# Patient Record
Sex: Male | Born: 1955 | Race: Black or African American | Hispanic: No | Marital: Single
Health system: Southern US, Community
[De-identification: ages and names within clinical notes are randomized; demographics above are authoritative.]

---

## 2005-05-19 ENCOUNTER — Emergency Department: Payer: Self-pay | Admitting: Emergency Medicine

## 2007-07-17 ENCOUNTER — Inpatient Hospital Stay: Payer: Self-pay | Admitting: Internal Medicine

## 2007-07-17 ENCOUNTER — Other Ambulatory Visit: Payer: Self-pay

## 2008-01-11 ENCOUNTER — Ambulatory Visit: Payer: Self-pay | Admitting: Nurse Practitioner

## 2009-05-21 ENCOUNTER — Inpatient Hospital Stay: Payer: Self-pay | Admitting: Internal Medicine

## 2009-11-18 ENCOUNTER — Emergency Department: Payer: Self-pay | Admitting: Emergency Medicine

## 2010-09-12 ENCOUNTER — Emergency Department: Payer: Self-pay | Admitting: Emergency Medicine

## 2011-07-10 ENCOUNTER — Inpatient Hospital Stay: Payer: Self-pay | Admitting: Internal Medicine

## 2011-07-10 LAB — COMPREHENSIVE METABOLIC PANEL
Albumin: 3.2 g/dL — ABNORMAL LOW (ref 3.4–5.0)
BUN: 33 mg/dL — ABNORMAL HIGH (ref 7–18)
Bilirubin,Total: 0.9 mg/dL (ref 0.2–1.0)
Calcium, Total: 8.7 mg/dL (ref 8.5–10.1)
Creatinine: 1.9 mg/dL — ABNORMAL HIGH (ref 0.60–1.30)
Glucose: 130 mg/dL — ABNORMAL HIGH (ref 65–99)
Osmolality: 279 (ref 275–301)
SGOT(AST): 547 U/L — ABNORMAL HIGH (ref 15–37)
SGPT (ALT): 78 U/L
Total Protein: 8.3 g/dL — ABNORMAL HIGH (ref 6.4–8.2)

## 2011-07-10 LAB — LIPASE, BLOOD: Lipase: 1305 U/L — ABNORMAL HIGH (ref 73–393)

## 2011-07-10 LAB — CBC
HGB: 12.1 g/dL — ABNORMAL LOW (ref 13.0–18.0)
MCV: 90 fL (ref 80–100)
Platelet: 355 10*3/uL (ref 150–440)
RDW: 14 % (ref 11.5–14.5)
WBC: 9.4 10*3/uL (ref 3.8–10.6)

## 2011-07-10 LAB — URINALYSIS, COMPLETE
Bacteria: NONE SEEN
Bilirubin,UR: NEGATIVE
Glucose,UR: NEGATIVE mg/dL (ref 0–75)
Ketone: NEGATIVE
Ph: 5 (ref 4.5–8.0)
RBC,UR: <1 /HPF (ref 0–5)
Specific Gravity: 1.024 (ref 1.003–1.030)
Squamous Epithelial: <1
WBC UR: 1 /HPF (ref 0–5)

## 2011-07-10 LAB — TROPONIN I: Troponin-I: 0.08 ng/mL — ABNORMAL HIGH

## 2011-07-11 LAB — TROPONIN I
Troponin-I: 0.06 ng/mL — ABNORMAL HIGH
Troponin-I: 0.04 ng/mL

## 2011-07-11 LAB — DRUG SCREEN, URINE
Barbiturates, Ur Screen: NEGATIVE (ref ?–200)
Benzodiazepine, Ur Scrn: NEGATIVE (ref ?–200)
Cannabinoid 50 Ng, Ur ~~LOC~~: NEGATIVE (ref ?–50)
Cocaine Metabolite,Ur ~~LOC~~: POSITIVE (ref ?–300)
MDMA (Ecstasy)Ur Screen: NEGATIVE (ref ?–500)
Opiate, Ur Screen: NEGATIVE (ref ?–300)
Phencyclidine (PCP) Ur S: NEGATIVE (ref ?–25)

## 2011-07-11 LAB — COMPREHENSIVE METABOLIC PANEL
Albumin: 2.6 g/dL — ABNORMAL LOW (ref 3.4–5.0)
Alkaline Phosphatase: 97 U/L (ref 50–136)
Anion Gap: 11 (ref 7–16)
Bilirubin,Total: 1.1 mg/dL — ABNORMAL HIGH (ref 0.2–1.0)
Calcium, Total: 8.2 mg/dL — ABNORMAL LOW (ref 8.5–10.1)
Co2: 26 mmol/L (ref 21–32)
Creatinine: 1.52 mg/dL — ABNORMAL HIGH (ref 0.60–1.30)
EGFR (Non-African Amer.): 51 — ABNORMAL LOW
Osmolality: 279 (ref 275–301)
Potassium: 3.9 mmol/L (ref 3.5–5.1)
Sodium: 137 mmol/L (ref 136–145)

## 2011-07-11 LAB — AMYLASE: Amylase: 73 U/L (ref 25–115)

## 2011-07-11 LAB — CK TOTAL AND CKMB (NOT AT ARMC): CK-MB: 1.3 ng/mL (ref 0.5–3.6)

## 2011-07-12 LAB — CBC WITH DIFFERENTIAL/PLATELET
Basophil #: 0 10*3/uL (ref 0.0–0.1)
Basophil %: 0.1 %
Eosinophil #: 0.1 10*3/uL (ref 0.0–0.7)
HGB: 11.2 g/dL — ABNORMAL LOW (ref 13.0–18.0)
Lymphocyte %: 13 %
MCH: 30.3 pg (ref 26.0–34.0)
MCHC: 33.3 g/dL (ref 32.0–36.0)
Monocyte #: 1.6 10*3/uL — ABNORMAL HIGH (ref 0.0–0.7)
Monocyte %: 14.7 %
Neutrophil %: 71.5 %
Platelet: 269 10*3/uL (ref 150–440)
RBC: 3.69 10*6/uL — ABNORMAL LOW (ref 4.40–5.90)
RDW: 14.3 % (ref 11.5–14.5)
WBC: 10.8 10*3/uL — ABNORMAL HIGH (ref 3.8–10.6)

## 2011-07-12 LAB — COMPREHENSIVE METABOLIC PANEL
Albumin: 2.4 g/dL — ABNORMAL LOW (ref 3.4–5.0)
Alkaline Phosphatase: 99 U/L (ref 50–136)
Anion Gap: 5 — ABNORMAL LOW (ref 7–16)
BUN: 25 mg/dL — ABNORMAL HIGH (ref 7–18)
Calcium, Total: 8.3 mg/dL — ABNORMAL LOW (ref 8.5–10.1)
Chloride: 100 mmol/L (ref 98–107)
EGFR (African American): 49 — ABNORMAL LOW
Osmolality: 269 (ref 275–301)
Potassium: 4 mmol/L (ref 3.5–5.1)
SGOT(AST): 320 U/L — ABNORMAL HIGH (ref 15–37)
Sodium: 132 mmol/L — ABNORMAL LOW (ref 136–145)
Total Protein: 7.2 g/dL (ref 6.4–8.2)

## 2011-07-13 LAB — CBC WITH DIFFERENTIAL/PLATELET
Basophil #: 0 10*3/uL (ref 0.0–0.1)
Eosinophil #: 0 10*3/uL (ref 0.0–0.7)
Eosinophil %: 0.2 %
HGB: 10.8 g/dL — ABNORMAL LOW (ref 13.0–18.0)
Lymphocyte %: 10.4 %
MCHC: 33 g/dL (ref 32.0–36.0)
Monocyte #: 1.5 10*3/uL — ABNORMAL HIGH (ref 0.0–0.7)
Monocyte %: 12.4 %
Neutrophil #: 9.4 10*3/uL — ABNORMAL HIGH (ref 1.4–6.5)
Neutrophil %: 77 %
Platelet: 270 10*3/uL (ref 150–440)
RBC: 3.57 10*6/uL — ABNORMAL LOW (ref 4.40–5.90)
WBC: 12.2 10*3/uL — ABNORMAL HIGH (ref 3.8–10.6)

## 2011-07-13 LAB — HEPATIC FUNCTION PANEL A (ARMC)
Alkaline Phosphatase: 84 U/L (ref 50–136)
Bilirubin, Direct: 0.7 mg/dL — ABNORMAL HIGH (ref 0.00–0.20)
SGOT(AST): 259 U/L — ABNORMAL HIGH (ref 15–37)
SGPT (ALT): 41 U/L

## 2011-07-13 LAB — PROTIME-INR: INR: 1.2

## 2011-07-13 LAB — BASIC METABOLIC PANEL
BUN: 26 mg/dL — ABNORMAL HIGH (ref 7–18)
Calcium, Total: 8.8 mg/dL (ref 8.5–10.1)
Chloride: 100 mmol/L (ref 98–107)
Co2: 25 mmol/L (ref 21–32)
Creatinine: 1.78 mg/dL — ABNORMAL HIGH (ref 0.60–1.30)
Osmolality: 270 (ref 275–301)
Potassium: 4.1 mmol/L (ref 3.5–5.1)

## 2011-07-14 LAB — BASIC METABOLIC PANEL
Calcium, Total: 8.6 mg/dL (ref 8.5–10.1)
Co2: 24 mmol/L (ref 21–32)
EGFR (African American): >60
EGFR (Non-African Amer.): 55 — ABNORMAL LOW
Glucose: 124 mg/dL — ABNORMAL HIGH (ref 65–99)
Osmolality: 277 (ref 275–301)
Potassium: 4.1 mmol/L (ref 3.5–5.1)

## 2011-07-14 LAB — CBC WITH DIFFERENTIAL/PLATELET
Basophil #: 0 10*3/uL (ref 0.0–0.1)
Eosinophil %: 0.4 %
HCT: 34.6 % — ABNORMAL LOW (ref 40.0–52.0)
Lymphocyte #: 1.1 10*3/uL (ref 1.0–3.6)
MCHC: 32.3 g/dL (ref 32.0–36.0)
MCV: 92 fL (ref 80–100)
Monocyte %: 14.1 %
Neutrophil #: 7.1 10*3/uL — ABNORMAL HIGH (ref 1.4–6.5)
RBC: 3.75 10*6/uL — ABNORMAL LOW (ref 4.40–5.90)
RDW: 13.8 % (ref 11.5–14.5)
WBC: 9.7 10*3/uL (ref 3.8–10.6)

## 2011-07-14 LAB — AFP TUMOR MARKER: AFP-Tumor Marker: 485.4 ng/mL — ABNORMAL HIGH (ref 0.0–8.3)

## 2011-07-14 LAB — HEPATIC FUNCTION PANEL A (ARMC)
Albumin: 2.3 g/dL — ABNORMAL LOW (ref 3.4–5.0)
Alkaline Phosphatase: 79 U/L (ref 50–136)
Bilirubin,Total: 1.1 mg/dL — ABNORMAL HIGH (ref 0.2–1.0)
SGOT(AST): 111 U/L — ABNORMAL HIGH (ref 15–37)
SGPT (ALT): 36 U/L
Total Protein: 7.8 g/dL (ref 6.4–8.2)

## 2011-07-15 ENCOUNTER — Ambulatory Visit: Payer: Self-pay | Admitting: Oncology

## 2011-07-15 LAB — PROTIME-INR: INR: 1.1

## 2011-07-15 LAB — COMPREHENSIVE METABOLIC PANEL
Anion Gap: 10 (ref 7–16)
Bilirubin,Total: 0.8 mg/dL (ref 0.2–1.0)
Calcium, Total: 8.1 mg/dL — ABNORMAL LOW (ref 8.5–10.1)
Chloride: 101 mmol/L (ref 98–107)
Co2: 22 mmol/L (ref 21–32)
Creatinine: 1.16 mg/dL (ref 0.60–1.30)
EGFR (African American): >60
EGFR (Non-African Amer.): >60
Osmolality: 270 (ref 275–301)
SGPT (ALT): 27 U/L
Total Protein: 6.6 g/dL (ref 6.4–8.2)

## 2011-07-22 ENCOUNTER — Ambulatory Visit: Payer: Self-pay | Admitting: Oncology

## 2011-07-23 ENCOUNTER — Ambulatory Visit: Payer: Self-pay | Admitting: Oncology

## 2011-07-29 LAB — COMPREHENSIVE METABOLIC PANEL
Alkaline Phosphatase: 284 U/L — ABNORMAL HIGH (ref 50–136)
Bilirubin,Total: 0.6 mg/dL (ref 0.2–1.0)
Calcium, Total: 9.1 mg/dL (ref 8.5–10.1)
Chloride: 98 mmol/L (ref 98–107)
Co2: 30 mmol/L (ref 21–32)
EGFR (African American): 58 — ABNORMAL LOW
EGFR (Non-African Amer.): 48 — ABNORMAL LOW
Glucose: 126 mg/dL — ABNORMAL HIGH (ref 65–99)
SGOT(AST): 94 U/L — ABNORMAL HIGH (ref 15–37)
SGPT (ALT): 65 U/L

## 2011-07-29 LAB — CBC CANCER CENTER
Basophil #: 0 x10 3/mm (ref 0.0–0.1)
Eosinophil %: 5 %
HCT: 36.1 % — ABNORMAL LOW (ref 40.0–52.0)
HGB: 11.9 g/dL — ABNORMAL LOW (ref 13.0–18.0)
Lymphocyte #: 1.5 x10 3/mm (ref 1.0–3.6)
MCHC: 33.1 g/dL (ref 32.0–36.0)
MCV: 90 fL (ref 80–100)
Monocyte #: 0.8 x10 3/mm — ABNORMAL HIGH (ref 0.0–0.7)
Monocyte %: 16.6 %
Neutrophil %: 45.1 %
Platelet: 392 x10 3/mm (ref 150–440)
RDW: 14.9 % — ABNORMAL HIGH (ref 11.5–14.5)
WBC: 4.6 x10 3/mm (ref 3.8–10.6)

## 2011-07-29 LAB — PROTIME-INR
INR: 1
Prothrombin Time: 13.1 secs (ref 11.5–14.7)

## 2011-07-30 ENCOUNTER — Ambulatory Visit: Payer: Self-pay | Admitting: Oncology

## 2011-08-19 LAB — COMPREHENSIVE METABOLIC PANEL
Alkaline Phosphatase: 166 U/L — ABNORMAL HIGH (ref 50–136)
Anion Gap: 6 — ABNORMAL LOW (ref 7–16)
BUN: 19 mg/dL — ABNORMAL HIGH (ref 7–18)
Calcium, Total: 8.3 mg/dL — ABNORMAL LOW (ref 8.5–10.1)
Co2: 30 mmol/L (ref 21–32)
EGFR (Non-African Amer.): 53 — ABNORMAL LOW
Osmolality: 271 (ref 275–301)
SGOT(AST): 126 U/L — ABNORMAL HIGH (ref 15–37)
Sodium: 135 mmol/L — ABNORMAL LOW (ref 136–145)
Total Protein: 8.3 g/dL — ABNORMAL HIGH (ref 6.4–8.2)

## 2011-08-19 LAB — CBC CANCER CENTER
Basophil #: 0.1 x10 3/mm (ref 0.0–0.1)
Eosinophil #: 0.1 x10 3/mm (ref 0.0–0.7)
Eosinophil %: 1.9 %
MCH: 29.5 pg (ref 26.0–34.0)
Monocyte #: 1 x10 3/mm — ABNORMAL HIGH (ref 0.0–0.7)
Monocyte %: 13.3 %
Neutrophil %: 64.4 %
Platelet: 461 x10 3/mm — ABNORMAL HIGH (ref 150–440)
RBC: 3.64 10*6/uL — ABNORMAL LOW (ref 4.40–5.90)
RDW: 15.1 % — ABNORMAL HIGH (ref 11.5–14.5)
WBC: 7.8 x10 3/mm (ref 3.8–10.6)

## 2011-08-25 ENCOUNTER — Ambulatory Visit: Payer: Self-pay | Admitting: Gastroenterology

## 2011-08-26 ENCOUNTER — Emergency Department: Payer: Self-pay | Admitting: Emergency Medicine

## 2011-08-26 LAB — COMPREHENSIVE METABOLIC PANEL
Albumin: 2.8 g/dL — ABNORMAL LOW (ref 3.4–5.0)
Alkaline Phosphatase: 196 U/L — ABNORMAL HIGH (ref 50–136)
Anion Gap: 8 (ref 7–16)
BUN: 14 mg/dL (ref 7–18)
Bilirubin,Total: 0.8 mg/dL (ref 0.2–1.0)
Chloride: 101 mmol/L (ref 98–107)
Co2: 27 mmol/L (ref 21–32)
Creatinine: 1.01 mg/dL (ref 0.60–1.30)
Glucose: 161 mg/dL — ABNORMAL HIGH (ref 65–99)
Osmolality: 276 (ref 275–301)
SGOT(AST): 130 U/L — ABNORMAL HIGH (ref 15–37)
SGPT (ALT): 37 U/L
Sodium: 136 mmol/L (ref 136–145)

## 2011-08-26 LAB — APTT: Activated PTT: 31.1 secs (ref 23.6–35.9)

## 2011-08-26 LAB — CK TOTAL AND CKMB (NOT AT ARMC): CK, Total: 100 U/L (ref 35–232)

## 2011-08-26 LAB — DIFFERENTIAL
Eosinophil %: 1.6 %
Lymphocyte %: 23.4 %
Monocyte #: 0.5 10*3/uL (ref 0.0–0.7)
Monocyte %: 8.7 %
Neutrophil %: 65.9 %

## 2011-08-26 LAB — SEDIMENTATION RATE: Erythrocyte Sed Rate: 66 mm/hr — ABNORMAL HIGH (ref 0–20)

## 2011-08-26 LAB — PROTIME-INR
INR: 1
Prothrombin Time: 13.9 secs (ref 11.5–14.7)

## 2011-08-26 LAB — CBC
HCT: 37.1 % — ABNORMAL LOW (ref 40.0–52.0)
HGB: 12.2 g/dL — ABNORMAL LOW (ref 13.0–18.0)
MCH: 29.4 pg (ref 26.0–34.0)
MCV: 89 fL (ref 80–100)
RBC: 4.16 10*6/uL — ABNORMAL LOW (ref 4.40–5.90)
RDW: 14.8 % — ABNORMAL HIGH (ref 11.5–14.5)
WBC: 5.8 10*3/uL (ref 3.8–10.6)

## 2011-08-30 ENCOUNTER — Ambulatory Visit: Payer: Self-pay | Admitting: Oncology

## 2011-09-09 LAB — CBC CANCER CENTER
Basophil #: 0 x10 3/mm (ref 0.0–0.1)
Basophil %: 0.7 %
HCT: 30.3 % — ABNORMAL LOW (ref 40.0–52.0)
HGB: 10.2 g/dL — ABNORMAL LOW (ref 13.0–18.0)
Lymphocyte %: 27.7 %
MCHC: 33.5 g/dL (ref 32.0–36.0)
MCV: 88 fL (ref 80–100)
Monocyte #: 1 x10 3/mm (ref 0.2–1.0)
RBC: 3.46 10*6/uL — ABNORMAL LOW (ref 4.40–5.90)
WBC: 5.5 x10 3/mm (ref 3.8–10.6)

## 2011-09-09 LAB — COMPREHENSIVE METABOLIC PANEL
Albumin: 2.8 g/dL — ABNORMAL LOW (ref 3.4–5.0)
Anion Gap: 9 (ref 7–16)
BUN: 26 mg/dL — ABNORMAL HIGH (ref 7–18)
Bilirubin,Total: 0.5 mg/dL (ref 0.2–1.0)
Calcium, Total: 8.9 mg/dL (ref 8.5–10.1)
Osmolality: 273 (ref 275–301)
SGPT (ALT): 30 U/L
Total Protein: 9 g/dL — ABNORMAL HIGH (ref 6.4–8.2)

## 2011-09-29 ENCOUNTER — Ambulatory Visit: Payer: Self-pay | Admitting: Oncology

## 2011-10-06 ENCOUNTER — Emergency Department: Payer: Self-pay | Admitting: Emergency Medicine

## 2011-10-06 LAB — CBC
HCT: 28.7 % — ABNORMAL LOW (ref 40.0–52.0)
HGB: 9.5 g/dL — ABNORMAL LOW (ref 13.0–18.0)
MCH: 28.8 pg (ref 26.0–34.0)
MCHC: 33.1 g/dL (ref 32.0–36.0)
Platelet: 304 10*3/uL (ref 150–440)
RBC: 3.29 10*6/uL — ABNORMAL LOW (ref 4.40–5.90)
WBC: 8.1 10*3/uL (ref 3.8–10.6)

## 2011-10-06 LAB — COMPREHENSIVE METABOLIC PANEL
Alkaline Phosphatase: 139 U/L — ABNORMAL HIGH (ref 50–136)
Anion Gap: 8 (ref 7–16)
Calcium, Total: 8.3 mg/dL — ABNORMAL LOW (ref 8.5–10.1)
Chloride: 100 mmol/L (ref 98–107)
Co2: 25 mmol/L (ref 21–32)
Creatinine: 0.98 mg/dL (ref 0.60–1.30)
Osmolality: 269 (ref 275–301)
SGOT(AST): 71 U/L — ABNORMAL HIGH (ref 15–37)
SGPT (ALT): 17 U/L
Sodium: 133 mmol/L — ABNORMAL LOW (ref 136–145)
Total Protein: 8.3 g/dL — ABNORMAL HIGH (ref 6.4–8.2)

## 2011-10-06 LAB — URINALYSIS, COMPLETE
Bacteria: NONE SEEN
Bilirubin,UR: NEGATIVE
Blood: NEGATIVE
Glucose,UR: NEGATIVE mg/dL (ref 0–75)
Ketone: NEGATIVE
Nitrite: NEGATIVE
Protein: 30
RBC,UR: 1 /HPF (ref 0–5)
Squamous Epithelial: <1

## 2011-10-06 LAB — TROPONIN I: Troponin-I: <0.02 ng/mL

## 2011-10-06 LAB — PROTIME-INR: INR: 1.1

## 2011-10-06 LAB — LIPASE, BLOOD: Lipase: 110 U/L (ref 73–393)

## 2011-10-06 LAB — APTT: Activated PTT: 33.7 secs (ref 23.6–35.9)

## 2011-10-07 LAB — CBC CANCER CENTER
Basophil %: 0.6 %
Eosinophil %: 1.3 %
HGB: 9.7 g/dL — ABNORMAL LOW (ref 13.0–18.0)
MCV: 88 fL (ref 80–100)
Neutrophil %: 67.6 %
Platelet: 320 x10 3/mm (ref 150–440)
RBC: 3.42 10*6/uL — ABNORMAL LOW (ref 4.40–5.90)
RDW: 15.9 % — ABNORMAL HIGH (ref 11.5–14.5)
WBC: 6.7 x10 3/mm (ref 3.8–10.6)

## 2011-10-07 LAB — COMPREHENSIVE METABOLIC PANEL
Bilirubin,Total: 0.7 mg/dL (ref 0.2–1.0)
Calcium, Total: 8.8 mg/dL (ref 8.5–10.1)
Co2: 26 mmol/L (ref 21–32)
Creatinine: 0.96 mg/dL (ref 0.60–1.30)
EGFR (Non-African Amer.): >60
Glucose: 157 mg/dL — ABNORMAL HIGH (ref 65–99)
Osmolality: 270 (ref 275–301)
SGOT(AST): 66 U/L — ABNORMAL HIGH (ref 15–37)
Sodium: 133 mmol/L — ABNORMAL LOW (ref 136–145)
Total Protein: 9.4 g/dL — ABNORMAL HIGH (ref 6.4–8.2)

## 2011-10-14 LAB — CBC CANCER CENTER
Basophil #: 0 x10 3/mm (ref 0.0–0.1)
Basophil %: 0.5 %
Eosinophil #: 0 x10 3/mm (ref 0.0–0.7)
HCT: 30.7 % — ABNORMAL LOW (ref 40.0–52.0)
Lymphocyte %: 14.1 %
MCH: 28.4 pg (ref 26.0–34.0)
MCV: 87 fL (ref 80–100)
Monocyte #: 1 x10 3/mm (ref 0.2–1.0)
Monocyte %: 11.8 %
Neutrophil #: 6.5 x10 3/mm (ref 1.4–6.5)
Neutrophil %: 73.3 %
Platelet: 430 x10 3/mm (ref 150–440)
RBC: 3.54 10*6/uL — ABNORMAL LOW (ref 4.40–5.90)
RDW: 16.5 % — ABNORMAL HIGH (ref 11.5–14.5)
WBC: 8.9 x10 3/mm (ref 3.8–10.6)

## 2011-10-14 LAB — COMPREHENSIVE METABOLIC PANEL
Albumin: 2.4 g/dL — ABNORMAL LOW (ref 3.4–5.0)
BUN: 16 mg/dL (ref 7–18)
Calcium, Total: 8.5 mg/dL (ref 8.5–10.1)
Chloride: 95 mmol/L — ABNORMAL LOW (ref 98–107)
Co2: 26 mmol/L (ref 21–32)
EGFR (African American): >60
EGFR (Non-African Amer.): >60
Glucose: 161 mg/dL — ABNORMAL HIGH (ref 65–99)
Osmolality: 265 (ref 275–301)
Potassium: 3.8 mmol/L (ref 3.5–5.1)
SGOT(AST): 122 U/L — ABNORMAL HIGH (ref 15–37)
SGPT (ALT): 22 U/L
Sodium: 130 mmol/L — ABNORMAL LOW (ref 136–145)
Total Protein: 8.8 g/dL — ABNORMAL HIGH (ref 6.4–8.2)

## 2011-10-21 LAB — COMPREHENSIVE METABOLIC PANEL
Albumin: 2.5 g/dL — ABNORMAL LOW (ref 3.4–5.0)
Calcium, Total: 8.6 mg/dL (ref 8.5–10.1)
Chloride: 98 mmol/L (ref 98–107)
Co2: 25 mmol/L (ref 21–32)
EGFR (African American): >60
Glucose: 116 mg/dL — ABNORMAL HIGH (ref 65–99)
Osmolality: 269 (ref 275–301)
SGOT(AST): 131 U/L — ABNORMAL HIGH (ref 15–37)
SGPT (ALT): 34 U/L
Sodium: 134 mmol/L — ABNORMAL LOW (ref 136–145)

## 2011-10-21 LAB — CBC CANCER CENTER
Basophil #: 0.1 x10 3/mm (ref 0.0–0.1)
Basophil %: 0.9 %
Eosinophil #: 0.1 x10 3/mm (ref 0.0–0.7)
Eosinophil %: 1.8 %
HCT: 32.7 % — ABNORMAL LOW (ref 40.0–52.0)
MCH: 28.1 pg (ref 26.0–34.0)
MCV: 87 fL (ref 80–100)
Monocyte #: 0.8 x10 3/mm (ref 0.2–1.0)
Neutrophil #: 4.2 x10 3/mm (ref 1.4–6.5)
Neutrophil %: 62.9 %
Platelet: 453 x10 3/mm — ABNORMAL HIGH (ref 150–440)
RBC: 3.75 10*6/uL — ABNORMAL LOW (ref 4.40–5.90)
WBC: 6.7 x10 3/mm (ref 3.8–10.6)

## 2011-10-30 ENCOUNTER — Ambulatory Visit: Payer: Self-pay | Admitting: Oncology

## 2011-11-04 LAB — CBC CANCER CENTER
Basophil #: 0.1 x10 3/mm (ref 0.0–0.1)
Basophil %: 1 %
Eosinophil %: 3.9 %
HCT: 35 % — ABNORMAL LOW (ref 40.0–52.0)
HGB: 11.4 g/dL — ABNORMAL LOW (ref 13.0–18.0)
Lymphocyte #: 1.4 x10 3/mm (ref 1.0–3.6)
Lymphocyte %: 28.1 %
MCH: 28.2 pg (ref 26.0–34.0)
MCV: 87 fL (ref 80–100)
Platelet: 307 x10 3/mm (ref 150–440)

## 2011-11-04 LAB — COMPREHENSIVE METABOLIC PANEL
Albumin: 2.9 g/dL — ABNORMAL LOW (ref 3.4–5.0)
Alkaline Phosphatase: 287 U/L — ABNORMAL HIGH (ref 50–136)
BUN: 14 mg/dL (ref 7–18)
Chloride: 97 mmol/L — ABNORMAL LOW (ref 98–107)
Co2: 26 mmol/L (ref 21–32)
EGFR (African American): >60
EGFR (Non-African Amer.): >60
Osmolality: 271 (ref 275–301)
SGPT (ALT): 42 U/L
Sodium: 134 mmol/L — ABNORMAL LOW (ref 136–145)
Total Protein: 10.4 g/dL — ABNORMAL HIGH (ref 6.4–8.2)

## 2011-11-10 ENCOUNTER — Emergency Department: Payer: Self-pay | Admitting: Emergency Medicine

## 2011-11-10 LAB — COMPREHENSIVE METABOLIC PANEL
Alkaline Phosphatase: 243 U/L — ABNORMAL HIGH (ref 50–136)
Bilirubin,Total: 1.4 mg/dL — ABNORMAL HIGH (ref 0.2–1.0)
Chloride: 101 mmol/L (ref 98–107)
Co2: 25 mmol/L (ref 21–32)
Creatinine: 0.93 mg/dL (ref 0.60–1.30)
EGFR (African American): >60
EGFR (Non-African Amer.): >60
Glucose: 133 mg/dL — ABNORMAL HIGH (ref 65–99)
Osmolality: 275 (ref 275–301)
Total Protein: 10.1 g/dL — ABNORMAL HIGH (ref 6.4–8.2)

## 2011-11-10 LAB — CBC CANCER CENTER
Basophil #: 0 x10 3/mm (ref 0.0–0.1)
Basophil %: 0.3 %
Eosinophil #: 0 x10 3/mm (ref 0.0–0.7)
Eosinophil %: 0.6 %
Lymphocyte #: 0.8 x10 3/mm — ABNORMAL LOW (ref 1.0–3.6)
Lymphocyte %: 10.7 %
MCH: 28.4 pg (ref 26.0–34.0)
MCHC: 32.4 g/dL (ref 32.0–36.0)
MCV: 88 fL (ref 80–100)
Monocyte #: 0.4 x10 3/mm (ref 0.2–1.0)
Monocyte %: 4.8 %
Neutrophil %: 83.6 %
RDW: 18.9 % — ABNORMAL HIGH (ref 11.5–14.5)
WBC: 7.8 x10 3/mm (ref 3.8–10.6)

## 2011-11-29 ENCOUNTER — Ambulatory Visit: Payer: Self-pay | Admitting: Oncology

## 2011-12-01 LAB — CBC CANCER CENTER
Basophil #: 0.1 x10 3/mm (ref 0.0–0.1)
Basophil %: 1.2 %
Eosinophil #: 0.2 x10 3/mm (ref 0.0–0.7)
HGB: 10 g/dL — ABNORMAL LOW (ref 13.0–18.0)
Lymphocyte #: 1.3 x10 3/mm (ref 1.0–3.6)
MCH: 28.9 pg (ref 26.0–34.0)
MCHC: 32.2 g/dL (ref 32.0–36.0)
MCV: 90 fL (ref 80–100)
Monocyte #: 0.9 x10 3/mm (ref 0.2–1.0)
Neutrophil %: 64.3 %
Platelet: 353 x10 3/mm (ref 150–440)

## 2011-12-01 LAB — HEPATIC FUNCTION PANEL A (ARMC)
Albumin: 2.4 g/dL — ABNORMAL LOW (ref 3.4–5.0)
Bilirubin, Direct: 0.4 mg/dL — ABNORMAL HIGH (ref 0.00–0.20)
Bilirubin,Total: 0.8 mg/dL (ref 0.2–1.0)
SGOT(AST): 105 U/L — ABNORMAL HIGH (ref 15–37)
SGPT (ALT): 35 U/L
Total Protein: 8.2 g/dL (ref 6.4–8.2)

## 2011-12-10 ENCOUNTER — Emergency Department: Payer: Self-pay | Admitting: Emergency Medicine

## 2011-12-10 LAB — CBC WITH DIFFERENTIAL/PLATELET
Basophil #: 0 10*3/uL (ref 0.0–0.1)
Eosinophil %: 1 %
Lymphocyte #: 0.6 10*3/uL — ABNORMAL LOW (ref 1.0–3.6)
Lymphocyte %: 10.3 %
MCH: 29.7 pg (ref 26.0–34.0)
MCV: 89 fL (ref 80–100)
Monocyte #: 0.7 x10 3/mm (ref 0.2–1.0)
Neutrophil %: 75.8 %
Platelet: 359 10*3/uL (ref 150–440)
RBC: 3.64 10*6/uL — ABNORMAL LOW (ref 4.40–5.90)
RDW: 18.1 % — ABNORMAL HIGH (ref 11.5–14.5)
WBC: 5.6 10*3/uL (ref 3.8–10.6)

## 2011-12-10 LAB — COMPREHENSIVE METABOLIC PANEL
Bilirubin,Total: 1.4 mg/dL — ABNORMAL HIGH (ref 0.2–1.0)
Calcium, Total: 8.7 mg/dL (ref 8.5–10.1)
Chloride: 99 mmol/L (ref 98–107)
Co2: 25 mmol/L (ref 21–32)
EGFR (African American): >60
EGFR (Non-African Amer.): >60
Osmolality: 272 (ref 275–301)
Potassium: 3.7 mmol/L (ref 3.5–5.1)
SGOT(AST): 180 U/L — ABNORMAL HIGH (ref 15–37)
Sodium: 135 mmol/L — ABNORMAL LOW (ref 136–145)
Total Protein: 8.9 g/dL — ABNORMAL HIGH (ref 6.4–8.2)

## 2011-12-10 LAB — LIPASE, BLOOD: Lipase: 109 U/L (ref 73–393)

## 2011-12-15 ENCOUNTER — Emergency Department: Payer: Self-pay | Admitting: Emergency Medicine

## 2011-12-15 LAB — CBC
HCT: 35 % — ABNORMAL LOW (ref 40.0–52.0)
HGB: 11.5 g/dL — ABNORMAL LOW (ref 13.0–18.0)
MCH: 29.2 pg (ref 26.0–34.0)
MCHC: 32.8 g/dL (ref 32.0–36.0)
MCV: 89 fL (ref 80–100)
Platelet: 389 10*3/uL (ref 150–440)
RDW: 18 % — ABNORMAL HIGH (ref 11.5–14.5)

## 2011-12-15 LAB — URINALYSIS, COMPLETE
Bilirubin,UR: NEGATIVE
Blood: NEGATIVE
Glucose,UR: NEGATIVE mg/dL (ref 0–75)
Protein: 100
RBC,UR: NONE SEEN /HPF (ref 0–5)
Specific Gravity: 1.02 (ref 1.003–1.030)
WBC UR: 3 /HPF (ref 0–5)

## 2011-12-15 LAB — COMPREHENSIVE METABOLIC PANEL
Albumin: 2.8 g/dL — ABNORMAL LOW (ref 3.4–5.0)
Alkaline Phosphatase: 536 U/L — ABNORMAL HIGH (ref 50–136)
Anion Gap: 8 (ref 7–16)
BUN: 13 mg/dL (ref 7–18)
Bilirubin,Total: 1.5 mg/dL — ABNORMAL HIGH (ref 0.2–1.0)
Calcium, Total: 8.9 mg/dL (ref 8.5–10.1)
Chloride: 101 mmol/L (ref 98–107)
Creatinine: 0.8 mg/dL (ref 0.60–1.30)
EGFR (African American): >60
Glucose: 111 mg/dL — ABNORMAL HIGH (ref 65–99)
Potassium: 4.4 mmol/L (ref 3.5–5.1)
SGOT(AST): 176 U/L — ABNORMAL HIGH (ref 15–37)
Total Protein: 9.5 g/dL — ABNORMAL HIGH (ref 6.4–8.2)

## 2011-12-15 LAB — PROTIME-INR: Prothrombin Time: 14.4 secs (ref 11.5–14.7)

## 2011-12-15 LAB — LIPASE, BLOOD: Lipase: 81 U/L (ref 73–393)

## 2011-12-17 ENCOUNTER — Emergency Department: Payer: Self-pay | Admitting: Emergency Medicine

## 2011-12-17 ENCOUNTER — Emergency Department: Payer: Self-pay | Admitting: Internal Medicine

## 2011-12-17 LAB — CBC
HCT: 31.7 % — ABNORMAL LOW (ref 40.0–52.0)
HGB: 10.5 g/dL — ABNORMAL LOW (ref 13.0–18.0)
MCH: 29.1 pg (ref 26.0–34.0)
MCHC: 33 g/dL (ref 32.0–36.0)
MCV: 88 fL (ref 80–100)
Platelet: 431 10*3/uL (ref 150–440)
RBC: 3.6 10*6/uL — ABNORMAL LOW (ref 4.40–5.90)
RDW: 18 % — ABNORMAL HIGH (ref 11.5–14.5)

## 2011-12-17 LAB — URINALYSIS, COMPLETE
Bacteria: NONE SEEN
Hyaline Cast: 6
Leukocyte Esterase: NEGATIVE
Nitrite: NEGATIVE
Protein: 30
RBC,UR: <1 /HPF (ref 0–5)
Specific Gravity: 1.016 (ref 1.003–1.030)
Squamous Epithelial: NONE SEEN
WBC UR: 1 /HPF (ref 0–5)

## 2011-12-17 LAB — COMPREHENSIVE METABOLIC PANEL
Albumin: 2.7 g/dL — ABNORMAL LOW (ref 3.4–5.0)
Anion Gap: 11 (ref 7–16)
BUN: 16 mg/dL (ref 7–18)
Bilirubin,Total: 1.3 mg/dL — ABNORMAL HIGH (ref 0.2–1.0)
Chloride: 103 mmol/L (ref 98–107)
Co2: 21 mmol/L (ref 21–32)
Creatinine: 1.03 mg/dL (ref 0.60–1.30)
EGFR (African American): >60
Osmolality: 272 (ref 275–301)
Potassium: 3.3 mmol/L — ABNORMAL LOW (ref 3.5–5.1)
Total Protein: 8.8 g/dL — ABNORMAL HIGH (ref 6.4–8.2)

## 2011-12-17 LAB — LIPASE, BLOOD: Lipase: 61 U/L — ABNORMAL LOW (ref 73–393)

## 2011-12-19 ENCOUNTER — Emergency Department: Payer: Self-pay | Admitting: *Deleted

## 2011-12-19 LAB — URINALYSIS, COMPLETE
Bacteria: NONE SEEN
Bilirubin,UR: NEGATIVE
Blood: NEGATIVE
Glucose,UR: NEGATIVE mg/dL (ref 0–75)
Hyaline Cast: 9
Leukocyte Esterase: NEGATIVE
Nitrite: NEGATIVE
Ph: 5 (ref 4.5–8.0)
RBC,UR: 9 /HPF (ref 0–5)
Specific Gravity: 1.024 (ref 1.003–1.030)
Squamous Epithelial: <1

## 2011-12-19 LAB — COMPREHENSIVE METABOLIC PANEL
Albumin: 2.7 g/dL — ABNORMAL LOW (ref 3.4–5.0)
Anion Gap: 10 (ref 7–16)
BUN: 11 mg/dL (ref 7–18)
Bilirubin,Total: 1.8 mg/dL — ABNORMAL HIGH (ref 0.2–1.0)
Chloride: 104 mmol/L (ref 98–107)
Creatinine: 0.86 mg/dL (ref 0.60–1.30)
EGFR (African American): >60
Glucose: 94 mg/dL (ref 65–99)
Osmolality: 273 (ref 275–301)
Potassium: 3.3 mmol/L — ABNORMAL LOW (ref 3.5–5.1)
SGOT(AST): 115 U/L — ABNORMAL HIGH (ref 15–37)
Sodium: 137 mmol/L (ref 136–145)
Total Protein: 8.7 g/dL — ABNORMAL HIGH (ref 6.4–8.2)

## 2011-12-19 LAB — CBC
MCH: 28.6 pg (ref 26.0–34.0)
MCHC: 31.9 g/dL — ABNORMAL LOW (ref 32.0–36.0)
Platelet: 361 10*3/uL (ref 150–440)
WBC: 7.2 10*3/uL (ref 3.8–10.6)

## 2011-12-19 LAB — PROTIME-INR: INR: 1.2

## 2011-12-23 ENCOUNTER — Ambulatory Visit: Payer: Self-pay | Admitting: Oncology

## 2011-12-23 LAB — PROTIME-INR
INR: 1.5
Prothrombin Time: 18.1 secs — ABNORMAL HIGH (ref 11.5–14.7)

## 2011-12-29 ENCOUNTER — Ambulatory Visit: Payer: Self-pay | Admitting: Vascular Surgery

## 2011-12-30 ENCOUNTER — Ambulatory Visit: Payer: Self-pay | Admitting: Oncology

## 2012-01-02 ENCOUNTER — Ambulatory Visit: Payer: Self-pay | Admitting: Oncology

## 2012-01-02 ENCOUNTER — Emergency Department: Payer: Self-pay | Admitting: Emergency Medicine

## 2012-01-02 LAB — COMPREHENSIVE METABOLIC PANEL
Albumin: 2.4 g/dL — ABNORMAL LOW (ref 3.4–5.0)
Alkaline Phosphatase: 341 U/L — ABNORMAL HIGH (ref 50–136)
Anion Gap: 13 (ref 7–16)
Bilirubin,Total: 2 mg/dL — ABNORMAL HIGH (ref 0.2–1.0)
EGFR (African American): >60
Glucose: 144 mg/dL — ABNORMAL HIGH (ref 65–99)
Osmolality: 275 (ref 275–301)
Potassium: 3.6 mmol/L (ref 3.5–5.1)
SGOT(AST): 128 U/L — ABNORMAL HIGH (ref 15–37)
SGPT (ALT): 24 U/L (ref 12–78)
Sodium: 136 mmol/L (ref 136–145)
Total Protein: 8.5 g/dL — ABNORMAL HIGH (ref 6.4–8.2)

## 2012-01-02 LAB — PROTIME-INR: INR: 1.1

## 2012-01-02 LAB — CBC
HCT: 27.2 % — ABNORMAL LOW (ref 40.0–52.0)
HGB: 9.2 g/dL — ABNORMAL LOW (ref 13.0–18.0)
MCH: 30.2 pg (ref 26.0–34.0)
MCHC: 34 g/dL (ref 32.0–36.0)
MCV: 89 fL (ref 80–100)
RBC: 3.06 10*6/uL — ABNORMAL LOW (ref 4.40–5.90)
RDW: 17.1 % — ABNORMAL HIGH (ref 11.5–14.5)

## 2012-01-02 LAB — AMMONIA: Ammonia, Plasma: 80 mcmol/L — ABNORMAL HIGH (ref 11–32)

## 2012-01-03 LAB — CBC CANCER CENTER
Basophil %: 0.4 %
Eosinophil #: 0.6 x10 3/mm (ref 0.0–0.7)
Eosinophil %: 11.2 %
HCT: 24.9 % — ABNORMAL LOW (ref 40.0–52.0)
HGB: 8.4 g/dL — ABNORMAL LOW (ref 13.0–18.0)
Lymphocyte #: 0.7 x10 3/mm — ABNORMAL LOW (ref 1.0–3.6)
Lymphocyte %: 11.6 %
MCHC: 33.5 g/dL (ref 32.0–36.0)
MCV: 90 fL (ref 80–100)
Monocyte %: 13.6 %
Neutrophil #: 3.6 x10 3/mm (ref 1.4–6.5)
RDW: 17.6 % — ABNORMAL HIGH (ref 11.5–14.5)

## 2012-01-03 LAB — COMPREHENSIVE METABOLIC PANEL
Albumin: 2.2 g/dL — ABNORMAL LOW (ref 3.4–5.0)
Alkaline Phosphatase: 299 U/L — ABNORMAL HIGH (ref 50–136)
Calcium, Total: 8.7 mg/dL (ref 8.5–10.1)
Creatinine: 1.05 mg/dL (ref 0.60–1.30)
EGFR (Non-African Amer.): >60
Glucose: 145 mg/dL — ABNORMAL HIGH (ref 65–99)
Osmolality: 271 (ref 275–301)
SGOT(AST): 101 U/L — ABNORMAL HIGH (ref 15–37)
SGPT (ALT): 24 U/L (ref 12–78)
Sodium: 134 mmol/L — ABNORMAL LOW (ref 136–145)

## 2012-01-04 LAB — AFP TUMOR MARKER: AFP-Tumor Marker: 7940 ng/mL — ABNORMAL HIGH (ref 0.0–8.3)

## 2012-01-08 ENCOUNTER — Inpatient Hospital Stay: Payer: Self-pay | Admitting: Internal Medicine

## 2012-01-08 LAB — CBC
HCT: 24.7 % — ABNORMAL LOW (ref 40.0–52.0)
HGB: 8.4 g/dL — ABNORMAL LOW (ref 13.0–18.0)
MCV: 89 fL (ref 80–100)
WBC: 8.6 10*3/uL (ref 3.8–10.6)

## 2012-01-08 LAB — COMPREHENSIVE METABOLIC PANEL
Albumin: 2.3 g/dL — ABNORMAL LOW (ref 3.4–5.0)
Anion Gap: 13 (ref 7–16)
BUN: 24 mg/dL — ABNORMAL HIGH (ref 7–18)
Bilirubin,Total: 2.1 mg/dL — ABNORMAL HIGH (ref 0.2–1.0)
Calcium, Total: 8.6 mg/dL (ref 8.5–10.1)
EGFR (Non-African Amer.): >60
Glucose: 116 mg/dL — ABNORMAL HIGH (ref 65–99)
Osmolality: 271 (ref 275–301)
SGPT (ALT): 21 U/L (ref 12–78)
Sodium: 133 mmol/L — ABNORMAL LOW (ref 136–145)
Total Protein: 7.9 g/dL (ref 6.4–8.2)

## 2012-01-09 LAB — URINALYSIS, COMPLETE
Bilirubin,UR: NEGATIVE
Blood: NEGATIVE
Glucose,UR: NEGATIVE mg/dL (ref 0–75)
Ph: 5 (ref 4.5–8.0)
RBC,UR: NONE SEEN /HPF (ref 0–5)
Squamous Epithelial: NONE SEEN
WBC UR: NONE SEEN /HPF (ref 0–5)

## 2012-01-17 LAB — CBC CANCER CENTER
Basophil #: 0 x10 3/mm (ref 0.0–0.1)
Basophil %: 0.6 %
Eosinophil #: 0.2 x10 3/mm (ref 0.0–0.7)
Eosinophil %: 3.7 %
Lymphocyte %: 15.6 %
MCH: 29 pg (ref 26.0–34.0)
MCV: 90 fL (ref 80–100)
Monocyte %: 18.5 %
Neutrophil %: 61.6 %
Platelet: 371 x10 3/mm (ref 150–440)
RBC: 2.91 10*6/uL — ABNORMAL LOW (ref 4.40–5.90)
WBC: 6.5 x10 3/mm (ref 3.8–10.6)

## 2012-01-21 ENCOUNTER — Inpatient Hospital Stay: Payer: Self-pay | Admitting: Internal Medicine

## 2012-01-21 LAB — URINALYSIS, COMPLETE
Bacteria: NONE SEEN
Bilirubin,UR: NEGATIVE
Blood: NEGATIVE
Glucose,UR: NEGATIVE mg/dL
Hyaline Cast: 1
Ketone: NEGATIVE
Nitrite: NEGATIVE
Ph: 5
Protein: NEGATIVE
RBC,UR: 1 /HPF
Specific Gravity: 1.015
Squamous Epithelial: NONE SEEN
WBC UR: 3 /HPF

## 2012-01-21 LAB — COMPREHENSIVE METABOLIC PANEL
Anion Gap: 8 (ref 7–16)
Bilirubin,Total: 1.1 mg/dL — ABNORMAL HIGH (ref 0.2–1.0)
Chloride: 99 mmol/L (ref 98–107)
Creatinine: 1.26 mg/dL (ref 0.60–1.30)
EGFR (African American): >60
Glucose: 192 mg/dL — ABNORMAL HIGH (ref 65–99)
Potassium: 3.5 mmol/L (ref 3.5–5.1)
SGPT (ALT): 24 U/L (ref 12–78)
Sodium: 133 mmol/L — ABNORMAL LOW (ref 136–145)
Total Protein: 8.5 g/dL — ABNORMAL HIGH (ref 6.4–8.2)

## 2012-01-21 LAB — CBC
HGB: 9.2 g/dL — ABNORMAL LOW (ref 13.0–18.0)
MCH: 29.6 pg (ref 26.0–34.0)
MCV: 89 fL (ref 80–100)
Platelet: 366 10*3/uL (ref 150–440)
RBC: 3.12 10*6/uL — ABNORMAL LOW (ref 4.40–5.90)
WBC: 11.1 10*3/uL — ABNORMAL HIGH (ref 3.8–10.6)

## 2012-01-22 LAB — COMPREHENSIVE METABOLIC PANEL
Albumin: 1.9 g/dL — ABNORMAL LOW (ref 3.4–5.0)
Anion Gap: 8 (ref 7–16)
BUN: 15 mg/dL (ref 7–18)
Bilirubin,Total: 1.1 mg/dL — ABNORMAL HIGH (ref 0.2–1.0)
Chloride: 100 mmol/L (ref 98–107)
Co2: 28 mmol/L (ref 21–32)
Creatinine: 1.09 mg/dL (ref 0.60–1.30)
EGFR (Non-African Amer.): >60
Glucose: 132 mg/dL — ABNORMAL HIGH (ref 65–99)
Osmolality: 275 (ref 275–301)
Potassium: 3.4 mmol/L — ABNORMAL LOW (ref 3.5–5.1)
Sodium: 136 mmol/L (ref 136–145)
Total Protein: 8.1 g/dL (ref 6.4–8.2)

## 2012-01-22 LAB — CBC WITH DIFFERENTIAL/PLATELET
Basophil %: 0.5 %
Eosinophil #: 0.4 10*3/uL (ref 0.0–0.7)
HCT: 26.2 % — ABNORMAL LOW (ref 40.0–52.0)
HGB: 8.7 g/dL — ABNORMAL LOW (ref 13.0–18.0)
MCH: 29.6 pg (ref 26.0–34.0)
MCHC: 33.4 g/dL (ref 32.0–36.0)
MCV: 89 fL (ref 80–100)
Monocyte #: 1.4 x10 3/mm — ABNORMAL HIGH (ref 0.2–1.0)
Neutrophil #: 5.8 10*3/uL (ref 1.4–6.5)
Neutrophil %: 64.9 %
Platelet: 334 10*3/uL (ref 150–440)
WBC: 9 10*3/uL (ref 3.8–10.6)

## 2012-01-23 LAB — BASIC METABOLIC PANEL
Anion Gap: 7 (ref 7–16)
BUN: 19 mg/dL — ABNORMAL HIGH (ref 7–18)
Chloride: 102 mmol/L (ref 98–107)
Co2: 30 mmol/L (ref 21–32)
Creatinine: 1.32 mg/dL — ABNORMAL HIGH (ref 0.60–1.30)
EGFR (African American): >60
Potassium: 3.6 mmol/L (ref 3.5–5.1)
Sodium: 139 mmol/L (ref 136–145)

## 2012-01-23 LAB — MAGNESIUM: Magnesium: 1.7 mg/dL — ABNORMAL LOW

## 2012-01-24 LAB — BASIC METABOLIC PANEL
Anion Gap: 7 (ref 7–16)
BUN: 22 mg/dL — ABNORMAL HIGH (ref 7–18)
Chloride: 101 mmol/L (ref 98–107)
Creatinine: 1.24 mg/dL (ref 0.60–1.30)
EGFR (African American): >60
EGFR (Non-African Amer.): >60
Glucose: 105 mg/dL — ABNORMAL HIGH (ref 65–99)
Osmolality: 278 (ref 275–301)
Potassium: 3.6 mmol/L (ref 3.5–5.1)

## 2012-01-24 LAB — MAGNESIUM: Magnesium: 1.8 mg/dL

## 2012-01-30 ENCOUNTER — Ambulatory Visit: Payer: Self-pay | Admitting: Oncology

## 2012-02-14 LAB — CBC CANCER CENTER
Basophil #: 0.1 x10 3/mm (ref 0.0–0.1)
Eosinophil %: 2.5 %
Lymphocyte %: 11.2 %
MCH: 28.5 pg (ref 26.0–34.0)
MCV: 89 fL (ref 80–100)
Monocyte %: 16.5 %
Platelet: 307 x10 3/mm (ref 150–440)
RBC: 2.99 10*6/uL — ABNORMAL LOW (ref 4.40–5.90)
RDW: 16.3 % — ABNORMAL HIGH (ref 11.5–14.5)
WBC: 6.9 x10 3/mm (ref 3.8–10.6)

## 2012-02-14 LAB — COMPREHENSIVE METABOLIC PANEL WITH GFR
Albumin: 2.2 g/dL — ABNORMAL LOW (ref 3.4–5.0)
Alkaline Phosphatase: 385 U/L — ABNORMAL HIGH (ref 50–136)
Anion Gap: 7 (ref 7–16)
BUN: 24 mg/dL — ABNORMAL HIGH (ref 7–18)
Bilirubin,Total: 1 mg/dL (ref 0.2–1.0)
Calcium, Total: 8.5 mg/dL (ref 8.5–10.1)
Chloride: 97 mmol/L — ABNORMAL LOW (ref 98–107)
Co2: 26 mmol/L (ref 21–32)
Creatinine: 1.37 mg/dL — ABNORMAL HIGH (ref 0.60–1.30)
EGFR (African American): >60
EGFR (Non-African Amer.): 57 — ABNORMAL LOW
Glucose: 161 mg/dL — ABNORMAL HIGH (ref 65–99)
Osmolality: 268 (ref 275–301)
Potassium: 4.2 mmol/L (ref 3.5–5.1)
SGOT(AST): 115 U/L — ABNORMAL HIGH (ref 15–37)
SGPT (ALT): 32 U/L (ref 12–78)
Sodium: 130 mmol/L — ABNORMAL LOW (ref 136–145)
Total Protein: 8.6 g/dL — ABNORMAL HIGH (ref 6.4–8.2)

## 2012-02-16 ENCOUNTER — Emergency Department: Payer: Self-pay | Admitting: *Deleted

## 2012-02-16 LAB — URINALYSIS, COMPLETE
Bacteria: NONE SEEN
Bilirubin,UR: NEGATIVE
Glucose,UR: NEGATIVE mg/dL (ref 0–75)
Ketone: NEGATIVE
Leukocyte Esterase: NEGATIVE
Nitrite: NEGATIVE
Protein: 30
Specific Gravity: 1.016 (ref 1.003–1.030)
WBC UR: NONE SEEN /HPF (ref 0–5)

## 2012-02-17 LAB — COMPREHENSIVE METABOLIC PANEL
Albumin: 2.4 g/dL — ABNORMAL LOW (ref 3.4–5.0)
Alkaline Phosphatase: 462 U/L — ABNORMAL HIGH (ref 50–136)
Anion Gap: 10 (ref 7–16)
BUN: 17 mg/dL (ref 7–18)
Bilirubin,Total: 2.3 mg/dL — ABNORMAL HIGH (ref 0.2–1.0)
Calcium, Total: 9 mg/dL (ref 8.5–10.1)
Co2: 24 mmol/L (ref 21–32)
EGFR (Non-African Amer.): >60
Glucose: 130 mg/dL — ABNORMAL HIGH (ref 65–99)
Osmolality: 273 (ref 275–301)
Potassium: 3.7 mmol/L (ref 3.5–5.1)
Sodium: 135 mmol/L — ABNORMAL LOW (ref 136–145)
Total Protein: 9.4 g/dL — ABNORMAL HIGH (ref 6.4–8.2)

## 2012-02-17 LAB — AMMONIA: Ammonia, Plasma: 38 mcmol/L — ABNORMAL HIGH (ref 11–32)

## 2012-02-17 LAB — CBC
HCT: 29.1 % — ABNORMAL LOW (ref 40.0–52.0)
HGB: 9.8 g/dL — ABNORMAL LOW (ref 13.0–18.0)
MCH: 29.6 pg (ref 26.0–34.0)
MCHC: 33.8 g/dL (ref 32.0–36.0)
MCV: 87 fL (ref 80–100)
Platelet: 356 10*3/uL (ref 150–440)
RDW: 16 % — ABNORMAL HIGH (ref 11.5–14.5)
WBC: 8 10*3/uL (ref 3.8–10.6)

## 2012-02-17 LAB — LIPASE, BLOOD: Lipase: 172 U/L (ref 73–393)

## 2012-02-29 ENCOUNTER — Ambulatory Visit: Payer: Self-pay | Admitting: Oncology

## 2012-03-05 ENCOUNTER — Inpatient Hospital Stay: Payer: Self-pay | Admitting: Internal Medicine

## 2012-03-05 LAB — URINALYSIS, COMPLETE
Bacteria: NONE SEEN
Glucose,UR: NEGATIVE mg/dL (ref 0–75)
Leukocyte Esterase: NEGATIVE
Nitrite: NEGATIVE
Protein: 25
RBC,UR: <1 /HPF (ref 0–5)
Specific Gravity: 1.015 (ref 1.003–1.030)
Squamous Epithelial: NONE SEEN
WBC UR: 7 /HPF (ref 0–5)

## 2012-03-05 LAB — PROTIME-INR
INR: 1.4
Prothrombin Time: 17.3 secs — ABNORMAL HIGH (ref 11.5–14.7)

## 2012-03-05 LAB — CBC WITH DIFFERENTIAL/PLATELET
Basophil #: 0 10*3/uL (ref 0.0–0.1)
Basophil %: 0.4 %
Eosinophil #: 0 10*3/uL (ref 0.0–0.7)
HCT: 23.9 % — ABNORMAL LOW (ref 40.0–52.0)
HGB: 8.2 g/dL — ABNORMAL LOW (ref 13.0–18.0)
Lymphocyte #: 0.6 10*3/uL — ABNORMAL LOW (ref 1.0–3.6)
MCHC: 34.6 g/dL (ref 32.0–36.0)
MCV: 85 fL (ref 80–100)
Monocyte #: 1.1 x10 3/mm — ABNORMAL HIGH (ref 0.2–1.0)
Neutrophil #: 10.8 10*3/uL — ABNORMAL HIGH (ref 1.4–6.5)
RDW: 17.2 % — ABNORMAL HIGH (ref 11.5–14.5)

## 2012-03-05 LAB — COMPREHENSIVE METABOLIC PANEL
Albumin: 1.8 g/dL — ABNORMAL LOW (ref 3.4–5.0)
Alkaline Phosphatase: 325 U/L — ABNORMAL HIGH (ref 50–136)
Calcium, Total: 8.4 mg/dL — ABNORMAL LOW (ref 8.5–10.1)
Chloride: 95 mmol/L — ABNORMAL LOW (ref 98–107)
Co2: 19 mmol/L — ABNORMAL LOW (ref 21–32)
EGFR (African American): 19 — ABNORMAL LOW
EGFR (Non-African Amer.): 17 — ABNORMAL LOW
Osmolality: 278 (ref 275–301)
Potassium: 5.2 mmol/L — ABNORMAL HIGH (ref 3.5–5.1)
SGOT(AST): 271 U/L — ABNORMAL HIGH (ref 15–37)
SGPT (ALT): 31 U/L (ref 12–78)
Sodium: 129 mmol/L — ABNORMAL LOW (ref 136–145)

## 2012-03-05 LAB — SODIUM, URINE, RANDOM: Sodium, Urine Random: 59 mmol/L (ref 20–110)

## 2012-03-05 LAB — BASIC METABOLIC PANEL
Calcium, Total: 8.2 mg/dL — ABNORMAL LOW (ref 8.5–10.1)
Chloride: 96 mmol/L — ABNORMAL LOW (ref 98–107)
Co2: 22 mmol/L (ref 21–32)
EGFR (Non-African Amer.): 18 — ABNORMAL LOW
Osmolality: 281 (ref 275–301)
Potassium: 4.8 mmol/L (ref 3.5–5.1)
Sodium: 130 mmol/L — ABNORMAL LOW (ref 136–145)

## 2012-03-05 LAB — AMMONIA: Ammonia, Plasma: 42 mcmol/L — ABNORMAL HIGH (ref 11–32)

## 2012-03-06 LAB — COMPREHENSIVE METABOLIC PANEL
Alkaline Phosphatase: 319 U/L — ABNORMAL HIGH (ref 50–136)
Bilirubin,Total: 10.8 mg/dL — ABNORMAL HIGH (ref 0.2–1.0)
Co2: 19 mmol/L — ABNORMAL LOW (ref 21–32)
Creatinine: 3.26 mg/dL — ABNORMAL HIGH (ref 0.60–1.30)
EGFR (African American): 23 — ABNORMAL LOW
EGFR (Non-African Amer.): 20 — ABNORMAL LOW
Osmolality: 285 (ref 275–301)
SGOT(AST): 176 U/L — ABNORMAL HIGH (ref 15–37)
Sodium: 132 mmol/L — ABNORMAL LOW (ref 136–145)

## 2012-03-06 LAB — CBC WITH DIFFERENTIAL/PLATELET
Basophil #: 0.1 10*3/uL (ref 0.0–0.1)
Basophil %: 0.9 %
HCT: 24.8 % — ABNORMAL LOW (ref 40.0–52.0)
Lymphocyte #: 0.8 10*3/uL — ABNORMAL LOW (ref 1.0–3.6)
Lymphocyte %: 5.4 %
MCV: 86 fL (ref 80–100)
Monocyte #: 1.1 x10 3/mm — ABNORMAL HIGH (ref 0.2–1.0)
Monocyte %: 7.3 %
Neutrophil %: 86 %
Platelet: 571 10*3/uL — ABNORMAL HIGH (ref 150–440)
RBC: 2.89 10*6/uL — ABNORMAL LOW (ref 4.40–5.90)
RDW: 17.5 % — ABNORMAL HIGH (ref 11.5–14.5)
WBC: 14.7 10*3/uL — ABNORMAL HIGH (ref 3.8–10.6)

## 2012-03-31 ENCOUNTER — Ambulatory Visit: Payer: Self-pay | Admitting: Oncology

## 2012-03-31 DEATH — deceased

## 2013-01-09 IMAGING — CT CT ABDOMEN WO/W CM
2 of 5 series · 13 of 32 positions shown, 18 images · non-contrast
Comparison: none

REASON FOR EXAM: Portal vein thrombosis, abnormality right
COMMENTS:

PROCEDURE:   CT [HOSPITAL] 3337  CT ABDOMEN STANDARD WITH AND WITHOUT -  [DATE]
RESULT:
Comparison is made to prior study dated 07/12/2011.
TECHNIQUE: Helical 3 mm sections were obtained from the lung bases through
the superior iliac crest pre, immediate and delayed intravenous
administration of 100 ml of [id number].

[Series 2: without · axial · non-contrast · 0.84mm/px · z∈[-298,-52]mm · 7 of 110 slices shown]
[im 14/110  soft-tissue]
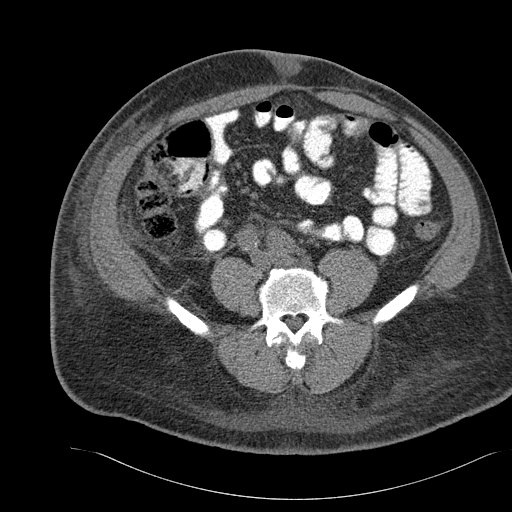
[im 28/110  soft-tissue]
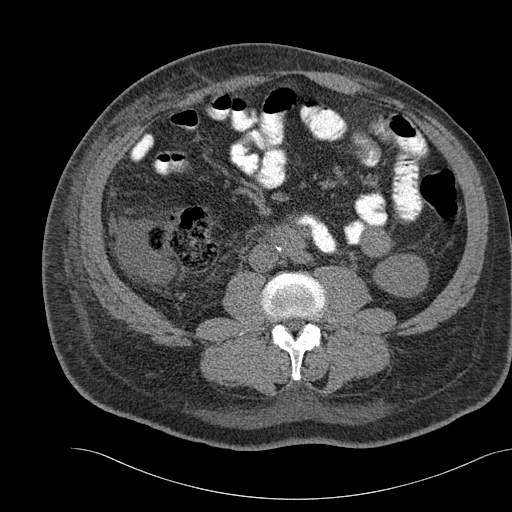
[im 41/110  soft-tissue]
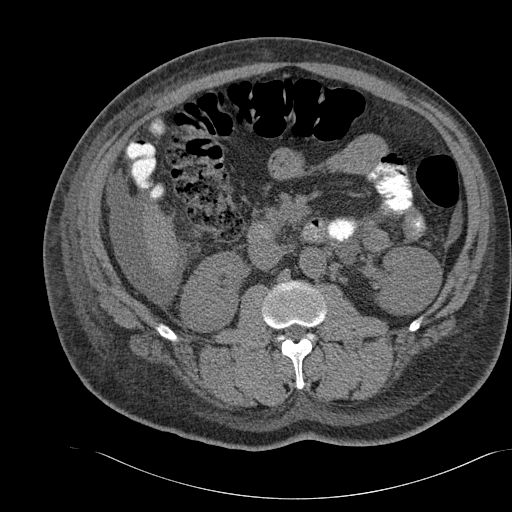
[im 55/110  soft-tissue]
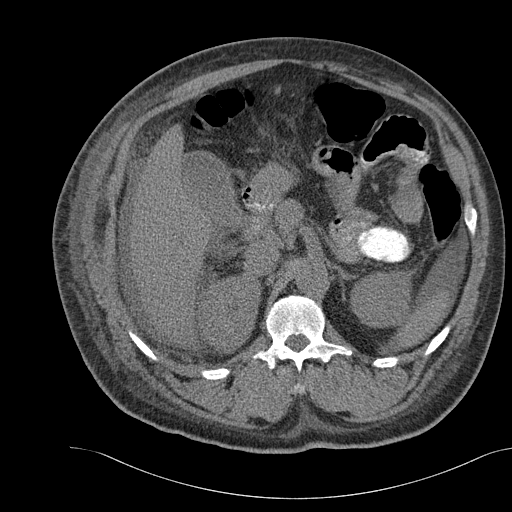
[im 69/110  soft-tissue]
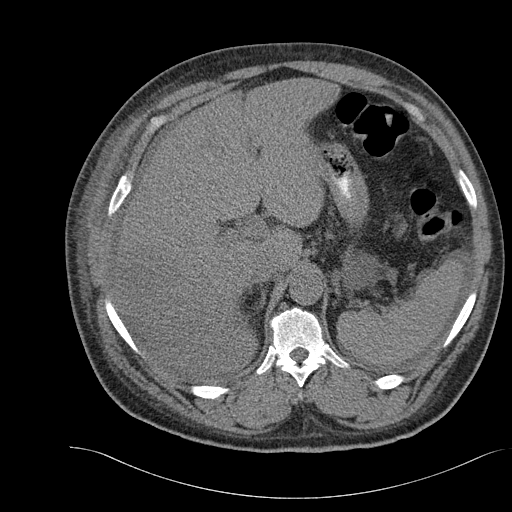
[im 82/110  soft-tissue]
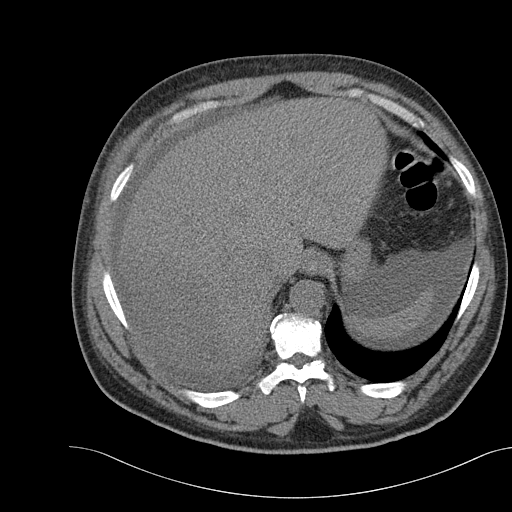
[im 96/110  soft-tissue]
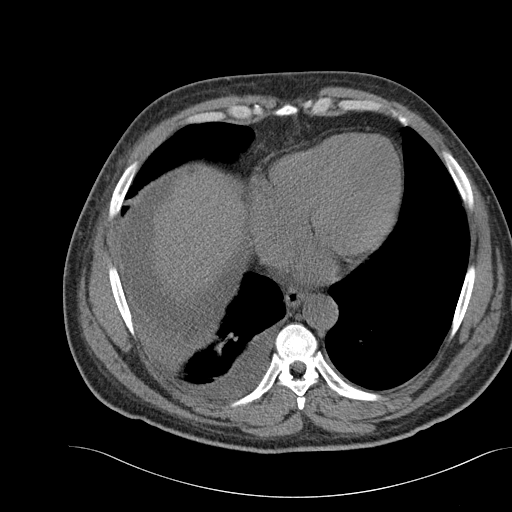

[Series 6: 70 sec · axial · 0.84mm/px · z∈[-293,-59]mm · 6 of 110 slices shown, 11 images]
[im 16/110  soft-tissue]
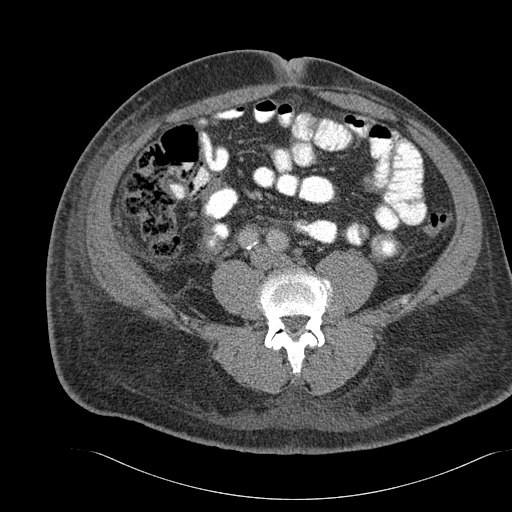
[im 16/110  bone]
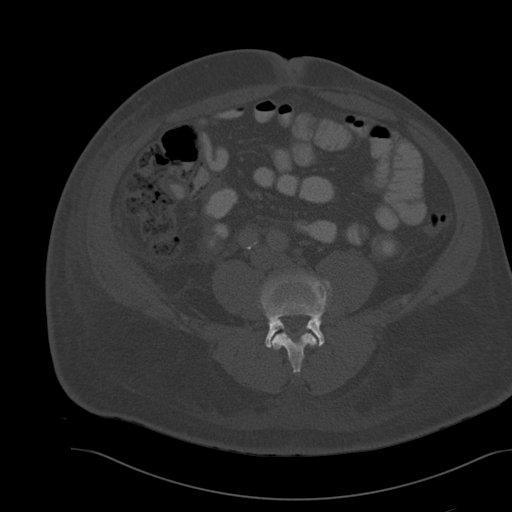
[im 32/110  soft-tissue]
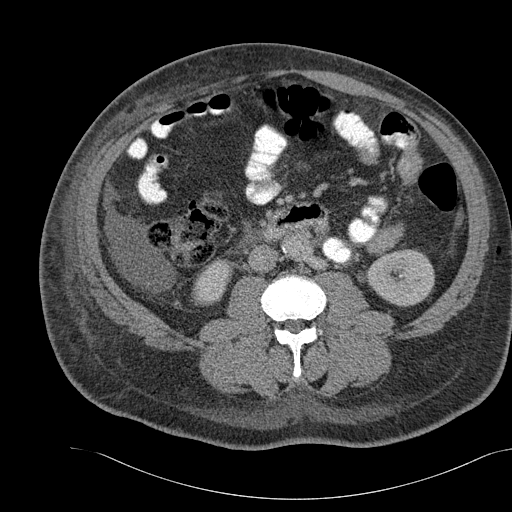
[im 47/110  soft-tissue]
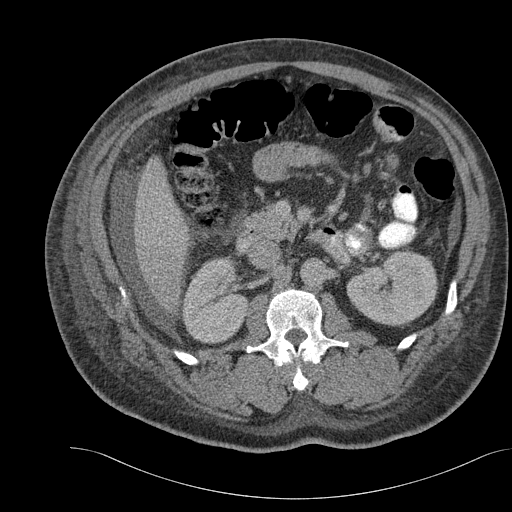
[im 47/110  lung]
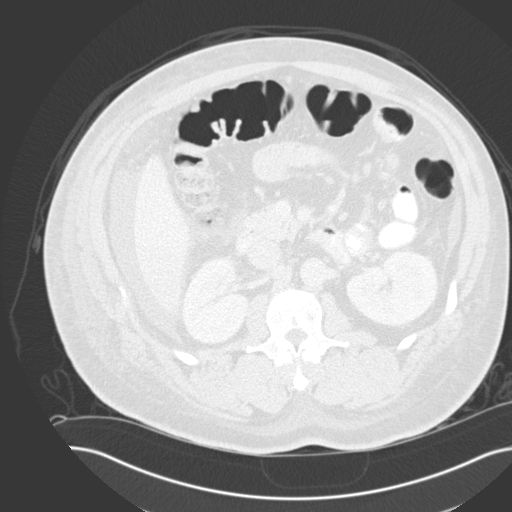
[im 63/110  soft-tissue]
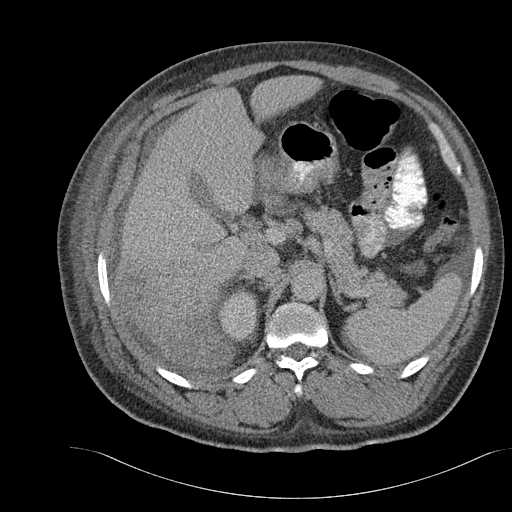
[im 63/110  lung]
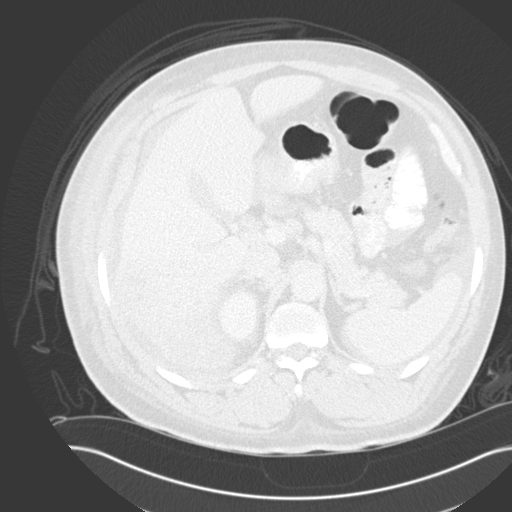
[im 78/110  soft-tissue]
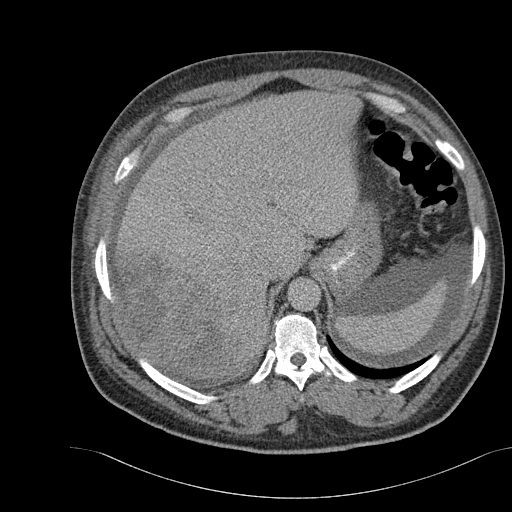
[im 78/110  lung]
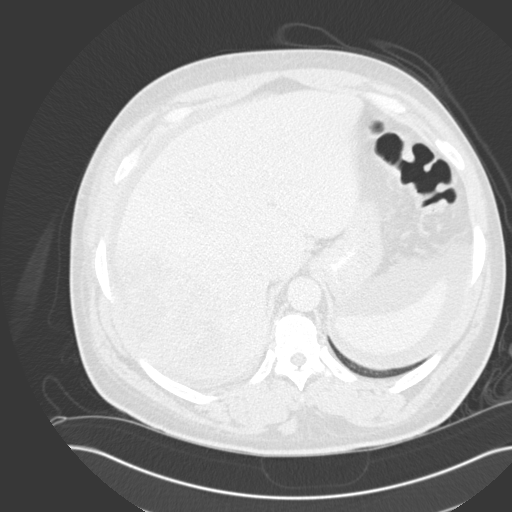
[im 94/110  soft-tissue]
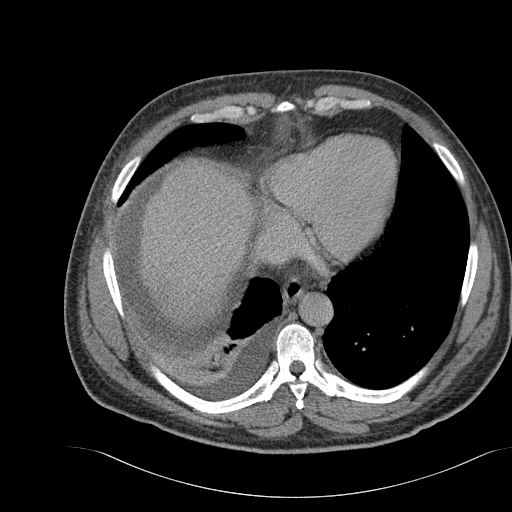
[im 94/110  lung]
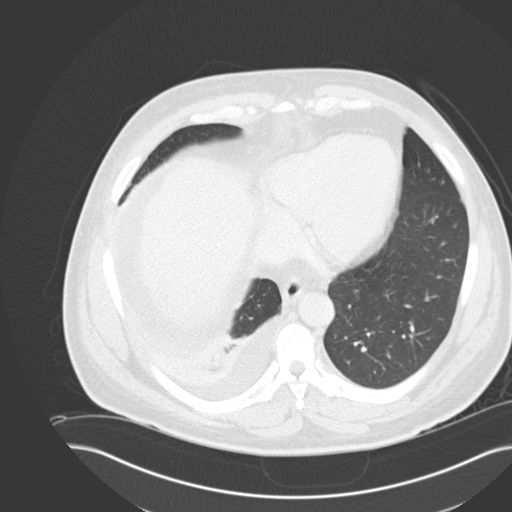

[13 of 32 positions shown; findings below may reference images not displayed]

FINDINGS: Evaluation of the lung bases demonstrates a small pleural effusion.

Atelectasis versus infiltrate is identified within the right lung base.

Evaluation of the liver once again demonstrates diffuse heterogeneous low
attenuation involving a large portion of the right lobe of the liver. Portal
venous phase imaging demonstrates no appreciable contrast within the right
portal vein. These findings again are consistent with thrombus within the
portal vein. The left portal appears patent. Contrast is appreciated within
the hepatic arteries. No further masses or regions of abnormal enhancement
are identified within the liver parenchyma. A small amount of perihepatic
and perisplenic ascites is identified.

The kidneys, adrenals, and pancreas are unremarkable. There is no CT
evidence of bowel obstruction or secondary signs reflecting enteritis,
colitis or drainable loculated fluid collections or gross evidence of
masses. There is no evidence of an abdominal aortic aneurysm.
IMPRESSION: 1.  Findings once again consistent with thrombus within the right portal
vein.
2.  Small amount of ascites.
3.  Heterogeneous enhancement and architecture within the right lobe of
liver. A component of this may be secondary to vascular compromise due to
the portal vein thrombosis. An underlying mass within the liver is also of
diagnostic concern which also may represent an etiology for the patient's
hypercoagulable state. Liver MRI may help to further characterize the right
lobe findings, if and as clinically warranted. Alternatively, surveillance
evaluation with CT recommended, if warranted.

## 2014-09-17 NOTE — H&P (Signed)
PATIENT NAME:  Andre Hart, Andre Hart MR#:  [id number] DATE OF BIRTH:  May 11, 1956  DATE OF ADMISSION:  03/05/2012  ER PHYSICIAN: Dr. Cyril Loosen  PRIMARY CARE PHYSICIAN: Dr. Doylene Canning   HISTORY OF PRESENT ILLNESS: 59 year old male with history of hepatitis C and liver cancer brought from home via EMS with confusion. Patient has history of hepatitis C, cirrhosis and hepatocellular carcinoma, history of hypertension, gastroesophageal reflux disease came in because of confusion. According to the family patient is more confused and lethargic since the last discharge and also noted to have poor p.o. intake. Patient was here from 08/23 to 08/26 for lower extremity cellulitis and edema. Patient was discharged on the 26th. According to the sister he was having lethargy, confusion and not taking the pills. They found the pills under pt  bed and patient also had poor p.o. intake so they brought him to see if his ammonia is elevated. Patient able to communicate with me. He is oriented to time, place, person. Denies any complaints. No headache. No abdominal pain and no trouble urinating. Patient ammonia level in the Emergency Room was slightly elevated at 42 but kidney function is worse with BUN of 65, creatinine 3.78 today and it was normal on 09/19 so we are going to admit him for acute renal failure with hyponatremia, dehydration and lethargy.   PAST MEDICAL HISTORY:  1. History of hepatocellular carcinoma, follows up with Dr. Doylene Canning. He is getting hospice services and he is not getting any chemotherapy and all the therapy stopped because of advanced disease and getting hospice since August and he has been followed by home hospice. 2. Hypertension. 3. Gastroesophageal reflux disease.   SOCIAL HISTORY: Previous smoker and drug abuse and alcoholic.   FAMILY HISTORY: Significant for diabetes and hypertension. No family history of colorectal cancer or breast cancer or ovarian cancer.   MEDICATIONS:  1. Magnesium oxide 400 mg  p.o. b.i.d.  2. Potassium 20 mEq p.o. daily.  3. Lasix 40 mg p.o. daily.  4. Hydroxyzine 25 mg p.o. 4 times daily for itching. 5. Fentanyl patch 75 mcg every three days. This was changed on Friday. 6. Dilaudid 2 mg every four hours p.r.n. for pain. 7. Aldactone 25 mg 2 tablets p.o. b.i.d.  8. Zofran 4 mg every eight hours.  9. Clonidine 0.2 mg p.o. b.i.d.  10. Amlodipine 10 mg p.o. daily.  11. Xanax 0.25 mg p.o. t.i.d.  12. Lactulose 30 mL p.o. b.i.d.  13. Proventil 2 puffs 4 times a day as needed for trouble breathing.   ALLERGIES: No known allergies.   PAST SURGICAL HISTORY:  1. History of liver biopsy.  2. Port-A-Cath placement.   REVIEW OF SYSTEMS: CONSTITUTIONAL: No fever. Has generalized weakness. EYES: No blurred vision. ENT: No tinnitus. No difficulty hearing. No epistaxis. RESPIRATORY: No cough. No trouble breathing. CARDIOVASCULAR: No chest pain. GASTROINTESTINAL: Denies any nausea, vomiting, or abdominal pain. Has chronic ascites. GENITOURINARY: Patient has no dysuria. INTEGUMENTARY: Dry skin and also lower extremity edema with chronic venostasis. MUSCULOSKELETAL: No joint pain. NEUROLOGICAL: Patient has no dysarthria or aphasia. PSYCH: Has anxiety. Denies any suicidal or homicidal ideation. Has history of alcohol and substance abuse before.   PHYSICAL EXAMINATION:  VITAL SIGNS: Temperature 98.8, pulse 95, respirations 22, blood 100/58, sats 97% on room air.   GENERAL: He is alert, awake, oriented and cachectic male, not in distress, answering questions appropriately.   HEENT: Head atraumatic, normocephalic. Pupils equally reacting to light. Extraocular movements intact.   NECK: Supple. No JVD. No carotid  bruits.   LUNGS: Clear to auscultation. No wheeze. No rales.   ABDOMEN: Ascites present. No tenderness. Bowel sounds present. No guarding. No hernias.   CARDIOVASCULAR: S1, S2 regular. PMI not displaced. No rubs.   SKIN: The patient has lower extremity eczema with  some ulceration and cracking in the skin with 3+ edema to thighs. Has dressing present in the right leg and has some serosanguineous drainage.   MUSCULOSKELETAL: No joint swelling.   NEUROLOGIC: Cranial nerves II through XII intact. Power is 5/5 in upper and lower extremities. Sensation intact. Deep tendon reflexes 2+ bilaterally.   LABORATORY, DIAGNOSTIC AND RADIOLOGICAL DATA: EKG: Normal sinus rhythm. No ST-T changes. Urinalysis is clear, amber color and 2+ glucose, leukocyte esterase negative. Chest x-ray: No acute disease of chest pain. Right side Port-A-Cath present. Low lung volumes. . Electrolytes: Sodium 129, potassium 5.2, chloride 95, bicarbonate 19, BUN 65, creatinine 3.78, glucose 114. LFTs: ALT 31, AST 271, alkaline phosphatase 325, INR 1.4. WBC 12.6, hemoglobin 8.2, hematocrit 23.9, platelets 545.   Ammonia 42. Creatinine 1.91 on 09/19 and also total bilirubin 2.3 at that time on 09/19. AST was 1122. Today AST 271, total bilirubin is up to 12.    ASSESSMENT AND PLAN:  661. 59 year old male with history of hepatocellular carcinoma. He was on palliative chemotherapy but that was stopped in August, now he under home hospice, came in with lethargy and poor p.o. intake, found to have acute renal failure with hyponatremia and hyperkalemia. Patient also had hypotension in the ER. Initial blood pressure was around 90/61 when I saw the patient and he received about a liter of fluid, getting 100 mL/h at this time. His acute renal failure likely due to dehydration with poor p.o. intake and also was taking Lasix and Aldactone. Patient is going to be admitted to medical service and start him on fluids, hold Lasix, Aldactone and clonidine and Norvasc and monitor the BMP for hyponatremia and hold the p.o. potassium supplements for hyperkalemia.  2. Worsening jaundice and worsening LFTs with advanced hepatocellular carcinoma. He is not a candidate for palliative chemotherapy also. We will ask palliative care  to see the patient and see if he can be a candidate for hospice home.  3. Leukocytosis with cellulitis in the legs with purulent drainage according to the family. Check his wound cultures and continue the patient on Zosyn.  CODE STATUS: DO NOT RESUSCITATE.   TIME SPENT: About 60 minutes.  Discussed with family, patient's sister and also daughter and brother in detail.   ____________________________ Katha HammingSnehalatha Mazelle Huebert, MD sk:cms D: 03/05/2012 13:37:05 ET T: 03/05/2012 14:04:24 ET JOB#: [id number]  cc: Katha HammingSnehalatha Stella Bortle, MD, <Dictator> Janak K. Doylene Canninghoksi, MD  Katha HammingSNEHALATHA Jo Booze MD ELECTRONICALLY SIGNED 03/23/2012 16:46

## 2014-09-17 NOTE — Discharge Summary (Signed)
PATIENT NAME:  Andre Hart, Andre Hart MR#:  [id number] DATE OF BIRTH:  Jan 18, 1956  DATE OF ADMISSION:  01/21/2012 DATE OF DISCHARGE:  01/24/2012  PRIMARY CARE PHYSICIAN: Dr. Doylene Canning  FINAL DIAGNOSES:  1. Lower extremity edema with secondary cellulitis.  2. Hepatocellular carcinoma.  3. Pruritus.  4. Anxiety.  5. Hypomagnesemia.  6. Hypokalemia.  7. Hypertension.   MEDICATIONS ON DISCHARGE:  1. Clonidine 0.2 mg, 1 tablet twice a day. 2. Zofran 4 mg, 1 to 2 tabs every eight hours as needed for nausea, vomiting. 3. Amlodipine 10 mg daily.  4. Aldactone 25 mg 2 tablets twice a day.  5. Hydromorphone 2 mg every four hours as needed for pain.  6. Proventil HFA 90 mcg 2 puffs 4 times a day as needed for shortness of breath.  7. Alprazolam 0.25 mg 3 times a day as needed for anxiety.  8. Lactulose 10 grams/15 mL, 30 mL twice a day. 9. Fentanyl patch 75 mcg topically every three days. 10. Hydroxyzine hydrochloride 25 mg 4 times a day.  11. Lubriderm lotion one application every 12 hours.  12. Furosemide 40 mg daily.  13. Potassium chloride 20 mEq daily.  14. Magnesium oxide 400 mg twice a day. 15. Amoxicillin clavulanate 500/125, 1 tablet twice a day for seven days.   DISPOSITION: Home with hospice.   DIET: Low sodium diet, regular consistency.   ACTIVITY: Activity as tolerated.   FOLLOW UP: Follow up with a 1 to 2 weeks with Dr. Doylene Canning.   REASON FOR ADMISSION: Patient was admitted 01/21/2012, discharged 01/24/2012. Came in with worsening lower extremity edema, oozing and tenderness.   HISTORY OF PRESENT ILLNESS: 59 year old man with hep C, liver cirrhosis, stage IV hepatocellular carcinoma, initially was on palliative chemotherapy and was being evaluated for hospice at home. Came in with worsening lower extremity edema. Most likely this was related to his liver disease and poor malnutrition state complicated by infection. He was placed on IV Unasyn and IV Lasix. For his hepatocellular  carcinoma he will follow up with hospice.   CODE STATUS: Patient is a DO NOT RESUSCITATE.   LABORATORY, DIAGNOSTIC AND RADIOLOGICAL DATA: INR 1.2, PT 15.2, white blood cell count 11.1, hemoglobin and hematocrit 9.2 and 27.8, platelet count 366, glucose 192, BUN 14, creatinine 1.26, sodium 133, potassium 3.5, chloride 99, CO2 26, calcium 8.3, total bilirubin 1.1, alkaline phosphatase 211, ALT 24, AST 121, total protein 8.5, albumin 2.0. Urinalysis: Trace leukocyte esterase. Creatinine upon discharge 1.24, sodium on discharge 137, potassium upon discharge 3.6. Magnesium 1.8.   HOSPITAL COURSE PER PROBLEM LIST:  1. For the patient's lower extremity edema, this was believed secondary to the liver disease, poor nutritional status and secondary to cellulitis. Patient is itching and has skin breakdown and scabbing throughout the legs. He was started on IV Unasyn. This was switched over to Augmentin upon discharge. He was given IV Lasix and then his creatinine started to creep up a little bit and then it was switched over to p.o. Lasix. His edema did improve but still has 3+ edema down on the lower extremity. No weeping seen at this point. He will finish up the course with antibiotics and will be on lifelong Lasix. 2. For the patient's hepatocellular carcinoma, seen in consultation by Dr. Lorre Nick. Patient will be undergoing hospice care. Overall prognosis poor. This will most likely be the cause of death.  3. For his pruritus, most likely with liver disease, he was started on Atarax and is itching less.  4.  Anxiety. He is on Xanax for this. 5. For his hypomagnesemia, this was replaced orally and IV during the hospital stay.  6. For the patient's hypokalemia, this was replaced IV and orally during the hospital stay. Since the patient will be on Lasix with the lower extremity edema will need to be on this chronically also. 7. For the patient's hypertension, this was controlled with clonidine and amlodipine. Can  consider stopping the amlodipine secondary to lower extremity edema but most likely this is course with the liver and poor nutritional status and the cellulitis. 8. For the patient's chronic pain, his fentanyl patch was increased to 75 mcg per day and he has Dilaudid p.r.n.   TIME SPENT ON DISCHARGE: 35 minutes.   ____________________________ Herschell Dimesichard J. Renae GlossWieting, MD rjw:cms D: 01/24/2012 16:33:06 ET T: 01/25/2012 10:25:19 ET JOB#: [id number]  cc: Herschell Dimesichard J. Renae GlossWieting, MD, <Dictator> Janak K. Doylene Canninghoksi, MD Salley ScarletICHARD J Ximenna Fonseca MD ELECTRONICALLY SIGNED 01/26/2012 16:50

## 2014-09-17 NOTE — Discharge Summary (Signed)
PATIENT NAME:  Andre Hart, Andre Hart MR#:  [id number] DATE OF BIRTH:  02/18/1956  DATE OF ADMISSION:  01/08/2012 DATE OF DISCHARGE:  01/10/2012  PRESENTING COMPLAINT: Altered mental status.   DISCHARGE DIAGNOSES:  1. Altered mental status due to hepatic encephalopathy with elevated ammonia.  2. Chronic cirrhosis of the liver.  3. Hepatocellular carcinoma with ongoing chemotherapy.  4. Chronic ascites and chronic leg edema.   CONDITION ON DISCHARGE:  Fair. The patient has a poor long-term prognosis.   MEDICATIONS:  1. Hydromorphone 2 mg, 1 tablet every four hours as needed.  2. Xanax 0.25, 1 tablet t.i.d.  3. Aldactone 25 mg, 2 tablets b.i.d.  4. Zofran 4 mg, 1 to 2 tablets every eight hours as needed for nausea, vomiting. 5. Clonidine 0.2 mg b.i.d.  6. Proventil HFA 2 puffs orally 4 times a day.  7. Docusate 100 mg b.i.d.  8. Amlodipine 10 mg daily.  9. Fentanyl 50-mcg transdermal every three days.  10. Lasix 40 mg b.i.d. for five days and then 20 mg p.o. daily.  11. Lactulose 30 mL b.i.d.   DIET: Low sodium, low protein diet.   REFERRALS: Resume Home Health and PT.  Rolling walker prescribed.  CONSULTATION:  Dr. Doylene Canning   LABS AT DISCHARGE:  Ammonia 76. Urinalysis negative for urinary tract infection. Ammonia level on admission was 104. Glucose 116, BUN 24, creatinine 1.24, sodium 133. Bilirubin 2.1, alkaline phosphatase 192, SGOT 179, total albumin is 2.3. Hemoglobin and hematocrit  8.4 and 24.7. White count 8.6. Lipase is 99. Chest x-ray: Minimal right lung fibrosis versus atelectasis.   CODE STATUS: FULL CODE.   BRIEF SUMMARY OF HOSPITAL COURSE: Andre Hart is a 59 year old African American gentleman with history of chronic cirrhosis of the liver along with hepatocellular carcinoma, chronic ascites, and leg edema who comes in with altered mental status. He was admitted with:  1. 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 I am not sure if the  patient was taking his lactulose as he is supposed to since he was not aware of the medicine.   His ammonia was 107 on admission, came down to 76 after taking lactulose around the clock. Mentation cleared up, much better prior to discharge. We will continue lactulose b.i.d. He did have some loose stools. No source of infection was noted.  2. Acute on chronic increased leg edema with chronic ascites, likely secondary to advancement of liver disease, portal hypertension, and portal vein thrombosis. His Lasix was increased to 40 mg b.i.d. and he continued Aldactone. The patient has been given an outpatient Lasix regimen which he is going to continue, increase in the dose for the next five days and then resume his home dose Lasix.  3. Hepatocellular cancer which was diagnosed February 2013, getting palliative chemotherapy. Dr. Doylene Canning saw the patient and recommended palliative care at home, and Hospice options were discussed with the patient's sister and brother. They will follow up with him as outpatient.  4. History of portal vein thrombosis and borderline hypertension.  5. Anemia of chronic disease. Hemoglobin remained stable.  6. Systemic hypertension. The patient has been on Norvasc.  7. History of alcohol and cocaine abuse.  8. Hospital stay otherwise remained stable.  9. CODE STATUS: The patient remained a FULL CODE.  FOLLOWUP:  Follow up with Dr. Doylene Canning 08/19 at 1:15.    TIME SPENT: 40 minutes.   ____________________________ Wylie Hail Allena Katz, MD sap:bjt D:  01/11/2012 07:02:18 ET  T: 01/11/2012 13:10:39 ET         JOB#: [id number]  cc: Valarie ConesJanak K. Doylene Canninghoksi, MD Willow OraSONA A Travers Goodley MD ELECTRONICALLY SIGNED 01/21/2012 12:40

## 2014-09-17 NOTE — Consult Note (Signed)
Brief Consult Note: Diagnosis: NO DISTRESS NO PAIN NO SOB, EDEMA LEGS BETTER.   Patient was seen by consultant.   Consult note dictated.   Orders entered.   Comments: SEE DICTATED NOTE.   PLEASE NOTE THAT AT HOME HE WAS ONLY USING DURAGESIC 50, NOT 75, AND PAIN IS WELL CONTROLLED NOW, I D/C THE 100MICROGRAM PATCH AND SUBSTITUTED 75MICROGRAMS, AS JUMP TO 100 MAY BE TOO SEDATING.  OTHERWISE LOOKSL LIKE EDEMA AND CELLULITIS ALREADY BETTER, WOULD CONSIDER RAPID CHANGE OVER FROM IV DILAUDID AND IV LASIX TO PO. HOSPICE ALREADY FOLLOWS, NEED TO RECONSULT HOSPICE [facility] RE DISCHARGE.  THANK YOU.  Electronic Signatures: Marin RobertsGittin, Robert G (MD)  (Signed 24-Aug-13 12:13)  Authored: Brief Consult Note   Last Updated: 24-Aug-13 12:13 by Marin RobertsGittin, Robert G (MD)

## 2014-09-17 NOTE — H&P (Signed)
PATIENT NAME:  Andre Hart, Andre Hart MR#:  [id number] DATE OF BIRTH:  1956/04/21  DATE OF ADMISSION:  01/08/2012  PRIMARY CARE PHYSICIAN:  Johney MaineJanak Choksi, MD  CHIEF COMPLAINT: Altered mental status, lethargy, and increased leg edema.   HISTORY OF PRESENT ILLNESS: Andre Hart is a 59 year old African American male with history of liver cancer, chronic hepatitis C, portal hypertension, systemic hypertension, and history of alcohol and cocaine abuse. The patient was noticed to be lethargic and have mental status change along with increased peripheral edema, mainly his lower extremities. The patient was brought by his relative to the emergency department for evaluation. I could not get a detailed history from the patient since he is very sleepy and participates just partially in conversation.   REVIEW OF SYSTEMS: CONSTITUTIONAL: Denies any fever. No chills but he has lethargy. EYES: No blurring of vision. No double vision. ENT: No hearing impairment. No sore throat. No dysphagia. CARDIOVASCULAR: No chest pain. No shortness of breath. No syncope but he has increased peripheral edema. RESPIRATORY: No chest pain. No cough. No sputum production. No shortness of breath. GASTROINTESTINAL: No abdominal pain. No vomiting and no diarrhea. No hematochezia. No melena. GENITOURINARY: No dysuria. No frequency of urination. MUSCULOSKELETAL: No joint pain or swelling. No muscular pain or swelling. INTEGUMENTARY: No skin rash. No ulcers. NEUROLOGY: No focal weakness. No seizure activity. No headache but he has mental status change and excessive lethargy. PSYCHIATRY: No anxiety. No depression. ENDOCRINE: No polyuria or polydipsia. No heat or cold intolerance.   PAST MEDICAL HISTORY:  1. Chronic hepatitis C.  2. Liver cancer.  3. Chronic kidney disease. 4. Diabetes mellitus type II.  5. Asthma.  6. Anemia of chronic diseases.  7. History of alcohol and cocaine abuse.  8. History of noncompliance.  9. Portal vein  thrombosis.  SOCIAL HABITS: Nonsmoker. He has history of alcohol dependence, primarily beer. It is unclear whether he is drinking lately or not. The patient is not giving any detailed history. He had abused cocaine in the past.   SOCIAL HISTORY: He is single. 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   ADMISSION MEDICATIONS:  1. Proventil HFA 2 puffs every 4 to 6 hours p.r.n.  2. Zofran 4 mg 1 to 2 tablets every 8 hours p.r.n.  3. Lasix 20 mg a day. 4. Lactulose 30 mL once a day. 5. Hydromorphone 2 mg every 4 hours p.r.n. pain. 6. Fentanyl 50 mcg every 72 hours. 7. Colace 100 mg twice a day. 8. Clonidine 0.2 mg twice a day. 9. Amlodipine 10 mg a day. 10. Alprazolam 0.25 mg three times daily. 11. Aldactone 25 mg 2 tablets twice a day.   ALLERGIES: No known drug allergies.   PHYSICAL EXAMINATION:   VITAL SIGNS: Blood pressure 111/75, respiratory rate 20, pulse 75, temperature 96.3, and oxygen saturation 96%.   GENERAL APPEARANCE: Middle-aged male lying in bed in no acute distress. He is sleepy but arousable.   HEAD AND NECK: No pallor. No icterus. No cyanosis.   EARS, NOSE, AND THROAT: Hearing was normal. Nasal mucosa, lips, and tongue were normal.   EYES: Normal eyelids and conjunctiva. Pupils are pinpointed, could not see reactivity to light.   NECK: Supple. Trachea at midline. No thyromegaly. No cervical lymphadenopathy. No masses.   HEART: Normal S1 and S2. No S3 or S4. No murmur. No gallop. No carotid bruits.   RESPIRATORY: Normal breathing pattern without use of accessory muscles. No rales. No wheezing.   ABDOMEN: Distended, soft without  tenderness. No hepatosplenomegaly. No masses. No hernias.   SKIN: No ulcers. No subcutaneous nodules. He has eczematous skin changes in lower extremities with cracking of skin along with peripheral edema of lower extremities. Edema is +3 from the ankles up to the knees.   MUSCULOSKELETAL: No joint swelling. No clubbing.   NEUROLOGIC: Cranial  nerves II through XII are intact. No focal motor deficit. The patient is lethargic and sleepy. Positive flapping tremors of hands.   PSYCHIATRIC: The patient is arousable. He is oriented to the place being in the hospital. He could not give me the date. Mood and affect were flat.   LABS/RADIOLOGIC STUDIES: Chest x-ray showed minimal right lung base fibrosis versus atelectasis.  Serum glucose 116, BUN 24, creatinine 1.24, sodium 133, and potassium 4. Lipase 104. Total protein 7.9, albumin 2.3, bilirubin 2.1, alkaline phosphatase 192, AST 179, and ALT 21. CBC showed white count of 8000, hemoglobin 8.4, hematocrit 24, and platelet count 335,000.   ASSESSMENT:  1. Hepatic encephalopathy as the cause of his altered mental status. 2. Increased leg edema likely secondary to advancement of his liver disease, portal hypertension, and portal vein thrombosis in addition to the contributory effect of amlodipine. 3. Liver cancer. 4. Portal vein thrombosis.  5. Chronic hepatitis C.  6. Anemia of chronic disease.  7. Diabetes mellitus, type II.  8. Systemic hypertension  9. History of alcohol and cocaine abuse.  10. History of noncompliance.   PLAN: We will admit the patient to the medical floor and follow up on his neurologic status. Start IV diuresis with intravenous Lasix to reduce the volume overload and peripheral edema. I will discontinue amlodipine since blood pressure is controlled and it may increase his peripheral edema. For his hepatic encephalopathy, we will start him on lactulose but increase the dose to 30 mL every 8 hours. Add oral neomycin 1 gram twice a day. Discontinue hydromorphone and Fentanyl and alprazolam as they may worsen his hepatic encephalopathy. We will monitor his response.   TIME SPENT EVALUATING THIS PATIENT: More than one hour.  This includes reviewing his medical records.  ____________________________ Carney Corners. Rudene Re, MD amd:slb D: 01/08/2012 23:34:41 ET T: 01/09/2012  10:38:31 ET JOB#: [id number]  cc: Carney Corners. Rudene Re, MD, <Dictator> Gerome Sam. Doylene Canning, MD Zollie Scale MD ELECTRONICALLY SIGNED 01/10/2012 2:19

## 2014-09-17 NOTE — Op Note (Signed)
PATIENT NAME:  Andre Hart, Andre Hart MR#:  [id number] DATE OF BIRTH:  04-30-1956  DATE OF PROCEDURE:  12/29/2011  PREOPERATIVE DIAGNOSIS: Liver cancer with limited venous access.  POSTOPERATIVE DIAGNOSIS: Liver cancer with limited venous access.  PROCEDURES PERFORMED: 1. Ultrasound guidance for vascular access, right jugular vein.  2. Fluoroscopic guidance for placement of catheter.  3. Placement of CT compatible Infuse-a-Port, right jugular vein.   SURGEON: Annice NeedyJason S. Tramya Schoenfelder, M.D.   ANESTHESIA: Local with moderate conscious sedation.   ESTIMATED BLOOD LOSS: 50 mL.  FLUOROSCOPY TIME: Approximately two minutes.   CONTRAST USED: None.   INDICATION FOR PROCEDURE: This is a 59 year old African American male with liver cancer and needs a Port-A-Cath for chemotherapy and other venous accesses.   DESCRIPTION OF PROCEDURE: The patient was brought to the vascular interventional radiology suite. The right neck and chest were sterilely prepped and draped and a sterile surgical field was created. The right jugular vein was visualized with ultrasound and found to widely patent. It was then accessed under direct ultrasound guidance without difficulty with a Seldinger needle and a J-wire was placed. After skin nick and dilatation, the peel-away sheath was placed over the wire and the wire and dilator were removed. I then turned my attention to an area two fingerbreadths below the right clavicle. I tunneled from the subclavicular incision to the access site. Fluoroscopic guidance was then used to cut the catheter and to an appropriate length and the catheter was then placed through the peel-away sheath and the peel-away sheath was removed. Initially there was a bit of kink at the skin entrance site and I was able to manipulate this with fluoroscopic guidance and remove this kink making the catheter run a much more fluid course. The subclavicular incision was irrigated with kanamycin-impregnated saline and then closed with  a running 3-0 Vicryl and a 4-0 Monocryl. Two 4-0 Monocryls were used to close the access site. Dermabond was placed as a dressing. The catheter tip was placed just into the right atrium. The patient tolerated the procedure well without obvious complications.  ____________________________ Annice NeedyJason S. Omayra Tulloch, MD jsd:slb D: 12/29/2011 13:41:00 ET T: 12/29/2011 13:49:09 ET JOB#: [id number]  cc: Annice NeedyJason S. Roxie Kreeger, MD, <Dictator> Gerome SamJanak K. Doylene Canninghoksi, MD Annice NeedyJASON S Charisa Twitty MD ELECTRONICALLY SIGNED 12/30/2011 13:52

## 2014-09-17 NOTE — Consult Note (Signed)
Note Type Consult   HPI: Referred by PCP, Dr. Ivor Costa.   This 59 year old Male patient presents to the clinic for follow up  for hepatocellular cancer.  Chief Complaint:   Historian Patient    Presenting Problem Patient was admitted in the hospital with Augmentin status    Positive Symptoms shortness of breath    Negative Symptoms fever, chills, anorexia, weakness, nausea, vomiting, diarrhea, constipation, cough, palpitations, burning with urination, urinary frequency, headache, numbness/tingling, back pain, hip pain, rash    Other Symptoms anxiety, swelling in feet   Subjective:  Chief Complaint/Diagnosis:   72. 59 year old African American gentleman with a previous history of smoking,  drug abuse, alcoholismadmitted in the hospital with liver mass.  Biopsy was done.consistent with hepatocellular cancer (February, 2013)Patient is starting Nexavar in March of 2013Patient developed significant rash and muscle pain with 800 mgm of nexavar. Which was discontinued on March 28th Patient was advised to resume Nexavar and all the symptoms have improved with reduced doseof progressive disease, Sorafanib  has been discontinuedon Avastin   HPI:     Patient  was admitted in the hospital with Augmentin status.  Increased amylase.  Easing confusion.over the weekend patient was treated with IV fluid and lactulose became much more alert.  Family is present I was as reevaluate patient in discuss with the family his overall prognosis.  According to sisterpatient has not informed family about illness.  Patient lives with his daughter.  Today   he is  much more alert oriented less pain   Review of Systems:   Performance Status (ECOG): 2   Lungs: cough  SOB   Cardiac: no complaints   GI: nausea/vomiting   GU: no complaints   Musculoskeletal: back pain   Extremities: swelling   Skin: no complaints   Neuro: Altered mental status which is improved   Endocrine: no complaints   Psych: no complaints    Pain ?: Yes   Pain- Qualitative: Moderate   Pain- Plan: Narcotic analgesic ordered   Discussed with patient: Prohylactic stimulant laxative  Stool softner   Pain Effectiveness: Pain relief obtained   Emotional well-being: Anxiety  Xanax was prescribed   Review of Systems: All other systems were reviewed and found to be negative   Review of Systems:   patient's condition has been declining   He is losing weight.  Has poor appetite.  Allergies:  No Known Allergies:   Significant History/PMH:   GERD - Esophageal Reflux:    cancer of liver:    asthma:    hypertension:    sinus surg:    portacath:   PFSH:  Comments: diabetes.  Hypertension.  In family No family history of colorectal cancer, breast cancer, or ovarian cancer.   Comments: History of acute and chronic alcoholism use of cocaine   does not smoke   Additional Past Medical and Surgical History: Systemic hypertension uncontrolled (noncompliance to medication)   Diabetes type II   Chronic kidney disease   History of of  bronchial asthma   Home Medications: Medication Instructions Last Modified Date/Time  hydromorphone 2 mg oral tablet 1 tab(s) orally every 4 hours, As Needed- for Pain  12-Aug-13 13:21  alprazolam 0.25 mg oral tablet 1 tab(s) orally 3 times a day 12-Aug-13 13:21  lactulose 10 g/15 mL oral syrup 30 milliliter(s) orally 2 times a day 12-Aug-13 13:21  Aldactone 25 mg oral tablet 2 tab(s) orally 2 times a day 12-Aug-13 13:21  ondansetron 4 mg oral  tablet, disintegrating 1-2 tab(s) orally every 8 hours, As Needed- for Nausea, Vomiting  12-Aug-13 13:21  clonidine 0.2 mg oral tablet 1 tab(s) orally 2 times a day 12-Aug-13 13:21  Proventil HFA 2  orally 4 times a day, As Needed 12-Aug-13 13:21  docusate sodium 100 mg oral tablet 1 tab(s) orally 2 times a day 12-Aug-13 13:21  amlodipine 10 mg oral tablet 1 tab(s) orally once a day 12-Aug-13 13:21  fentanyl 50 microgram(s) transdermal every 3 days 12-Aug-13  13:21  Lasix 40 mg oral tablet 1 tab(s) orally 2 times a day for 5 days then return to taking Lasix 62m daily 12-Aug-13 13:21   Vital Signs:   :: Ht(CM): 185.4 Wt(KG): 103.6 BSA: 2.2 Temp: 96.2 Pulse: 92 RR: 18  BP: 129/78   Physical Exam:   General: Patient is somewhat anxious but not in any acute distress   Much more alert and oriented tenderness been previously described in history of present illness   Head, Ears, Nose,Throat: normal, no lesions or deformities   Neck, Thyroid: no thyroid tenderness, enlargement or nodule.  neck supple without massess or tenderness. no adenopathy.   Respiratory: no rales, rhonchi, retractions or wheezing   Cardiovascular: regular rate and rhythm, no murmur, rub or gallop   Gastrointestinal: Palpable mass.  Increased the size of liver and tenderness.  Ascites.  Distention of abdomen.   Musculoskeletal: no muscular asymmetry noted.  no swelling or tenderness of joints.  ROM upper and lower extremities normal   Skin: no rashes, ulcers, or lesions   Neurological: normal motor exam, gait, 5 x 5, strength, grip, pressure, flexor, extensor   Lymphatics: no cervical, axillary, or inguinal lymphadenopathy   Psych: Patient lives by himself.  Spent Stimate that Dr.  FRegis Billis unaware of all   Laboratory Results: Hepatic:  10-Aug-13 20:00    Bilirubin, Total  2.1   Alkaline Phosphatase  192   SGPT (ALT) 21   SGOT (AST)  179   Total Protein, Serum 7.9   Albumin, Serum  2.3  Routine Chem:  10-Aug-13 20:00    Glucose, Serum  116   BUN  24   Creatinine (comp) 1.24   Sodium, Serum  133   Potassium, Serum 4.0   Chloride, Serum 98   CO2, Serum 22   Calcium (Total), Serum 8.6   Osmolality (calc) 271   eGFR (African American) >60   eGFR (Non-African American) >60 (eGFR values <617mmin/1.73 m2 may be an indication of chronic kidney disease (CKD). Calculated eGFR is useful in patients with stable renal function. The eGFR calculation will not be reliable in  acutely ill patients when serum creatinine is changing rapidly. It is not useful in  patients on dialysis. The eGFR calculation may not be applicable to patients at the low and high extremes of body sizes, pregnant women, and vegetarians.)   Anion Gap 13   Lipase 99 (Result(s) reported on 08 Jan 2012 at 08:33PM.)    21:16    Ammonia, Plasma  104 (Result(s) reported on 08 Jan 2012 at 10:05PM.)  Routine Hem:  10-Aug-13 20:00    WBC (CBC) 8.6   RBC (CBC)  2.77   Hemoglobin (CBC)  8.4   Hematocrit (CBC)  24.7   Platelet Count (CBC) 335 (Result(s) reported on 08 Jan 2012 at 08:30PM.)   MCV 89   MCH 30.4   MCHC 34.2   RDW  17.8   Lab Results Review:   Lab Results   CBC  :  03-Jan-2012 11:23:  Last Resulted Date/Time.5.7 Hgb: 8.4(L) Hct: 24.9(L) Plat: 351 MCV: 90 ANC: 3.6 .   Metabolic Panel  :  [id number] 11:23:  Last Resulted Date/Time.134(L) K+: 3.8 CO2: 22 Chl: 101 BUN: 12 Cre: 1.05 Glu:145(H) Calcium: 8.7 Alk Phos: 299(H) ALT: 24 AST: 101(H) T.Prot: 7.6 Alb: 2.2(L) eGFR-AA: >60 eGFR: >60 .    :  15-Dec-2011 10:09:  Last Resulted Date/Time.1.7(L) .  :  19-Aug-2011 09:16:  Last Resulted Date/Time.2.2 . 19-9  :  19-Aug-2011 09:16:  Last Resulted Date/Time.19-9: 43(H) .   Review Pathology Report:  Pathology Reports:   Pathology Report:  (23-Jul-2011) Pre-operative diagnosis:                                        .MASS.GUIDED CORE BIOPSY OF LIVER MASS.       [   Final Report         ]                  Assessment, touch preparation, liver biopsy:1: please obtain additional tissue.2: Lesional cells presentto Dr. Orson Ape on 07/23/2011 at 12 PM. T.Rubinas MD.DIAGNOSIS, LIVER, CT GUIDED BIOPSY:LIVER WITH CIRRHOSIS AND FOCAL NODULE OF MALIGNANT NEOPLASM,CONSISTENT WITH HEPATOCELLULAR CARCINOMA.patient has a large liver mass and elevated AFP. Anstain for HepPar1 was performed and isPoorly differentiated hepatocellular carcinomas can befor HepPar1. These findings were communicatedto Dr.on  07/28/11. Intradepartmental consultation was obtained.                              [   Final Report         ]                  signed:                                          .Dickey Gave, MD, Pathologist                              [   Final Report         ]                    Assessment and Plan:  Impression:   1. Hepatocellular cancer- He is currently taking reduced dose of Nexavar and tolerating with limited symptoms. Has had weight loss and decreased appetite encephalopathy responding to let you know the  Plan:   I had prolonged discussion with patient and his sister.  During part of the discussing his brother was also on the speaker   phone.  We discussed overall poor prognosis.  Disease is incurable.  We discussed number of social issues frequent emergency room visit Availability of palliative care team as well as hospice care All these options were discussed with the patient and family.  Patient will followup with me as outpatient and weekend he meanwhile on in the palliative care as outpatient  Electronic Signatures: Jobe Gibbon (MD)  (Signed 12-Aug-13 14:04)  Authored: Note Type, History of Present Illness, CC/HPI, Review of Systems, ALLERGIES, PAST MEDICAL HISTORY, Patient Family Social History, HOME MEDICATIONS, Vital Signs, Physical Exam, Lab Results Review, Pathology Report Review, Assessment and Plan  Last Updated: 12-Aug-13 14:04 by Jobe Gibbon (MD)

## 2014-09-17 NOTE — H&P (Signed)
PATIENT NAME:  Andre Hart, Andre Hart MR#:  [id number] DATE OF BIRTH:  1955-06-14  DATE OF ADMISSION:  01/21/2012  REFERRING PHYSICIAN: Glennie IsleSheryl Gottlieb, MD  PRIMARY CARE PHYSICIAN/PRIMARY ONCOLOGIST: Johney MaineJanak Choksi, MD  CHIEF COMPLAINT: Worsening lower extremity edema and oozing and tenderness.   HISTORY OF PRESENT ILLNESS: This is a 59 year old male with significant past medical history of hepatitis C, liver cirrhosis, and stage IV hepatocellular carcinoma who was initially on palliative chemotherapy, intially on nexavar which was changed to Avastin where he is currently receiving Avastin. He has one more dose left on Avastin. The patient was recently discharged from [facility] for hepatic encephalopathy. The patient was seen by Dr. Johney MaineJanak Choksi at his office on 01/17/2012. The patient is being evaluated by hospice care at home. The patient was complaining of worsening lower extremity edema and oozing. Hospice was supposed to evaluate the patient today at home, but the patient came to the ED prior to them seeing him at home. Lower extremity edema and oozing is chronic and this is related to his ascites and renal failure where the patient has been taking Aldactone and Lasix at home for that. Hospice service could not evaluate the patient today in the ED, so the patient will be admitted to the hospitalist service for management of his worsening lower extremity edema and cellulitis.   The patient denies any fever but complains of progressive weakness and fatigue. He denies any shortness of breath, coffee-ground emesis, hemoptysis, worsening shortness of breath, melena, or bright red blood per rectum.   PAST MEDICAL HISTORY:  1. Chronic hepatitis C.  2. Hepatocellular carcinoma stage IV on palliative chemotherapy.  3. Chronic ascites and lower extremity edema.  4. Chronic kidney disease.  5. Diabetes mellitus type 2.  6. Asthma.  7. Anemia of chronic disease.  8. History of alcohol and  cocaine abuse.  9. History of noncompliance.  10. Portable vein thrombosis.   SOCIAL HABITS: The patient is a nonsmoker. There is history of alcohol abuse and cocaine abuse in the past.   SOCIAL HISTORY: The patient lives at home.   FAMILY HISTORY: Significant for hypertension and diabetes involving both parents who are deceased. He has a daughter who has hypertension and diabetes as well.   ALLERGIES: No known drug allergies.   HOME MEDICATIONS:  1. Proventil 2 puffs four times daily as needed.  2. Zofran 4 mg 1 to 2 tablets every eight hours as needed.  3. Lasix 20 mg daily.  4. Lactulose 30 mL twice a day. 5. Hydromorphone 2 mg one tablet every four hours as needed.  6. Duragesic patch 75 mcg every 72 hours.  7. Docusate 100 mg twice a day.   8. Clonidine 0.2 mg twice a day. 9. Norvasc 10 mg daily.  10. Alprazolam 0.25 mg three times daily. 11. Aldactone 25 mg twice a day.  REVIEW OF SYSTEMS: CONSTITUTIONAL: The patient denies any fever. Complains of generalized fatigue and weakness. EYES: Denies blurry vision, double vision, or pain. ENT: Denies tinnitus, ear pain, or hearing loss. RESPIRATORY: Denies any cough, wheezing, hemoptysis, or dyspnea. CARDIOVASCULAR: Denies any chest pain or orthopnea. Has chronic lower extremity edema. Denies any palpitations or syncope. GASTROINTESTINAL: Denies nausea, vomiting, diarrhea, constipation, or abdominal pain. Has chronic ascites. GU: Denies dysuria, hematuria, or renal colic. ENDOCRINE: Denies polyuria, polydipsia, heat or cold intolerance. HEMATOLOGY: Denies anemia, easy bruising, bleeding, or diathesis. INTEGUMENTARY: Denies any acne. Has lower extremity chronic changes with rash. MUSCULOSKELETAL: Denies any neck pain,  shoulder pain, or knee pain. NEUROLOGIC: Denies any numbness, dysarthria, epilepsy, tremors, or vertigo. PSYCH: Has anxiety. Denies any schizophrenia or bipolar disorder. Has history of alcohol and substance abuse.   PHYSICAL  EXAMINATION:  VITAL SIGNS: Temperature 97.9, pulse 94, respiratory rate 14, blood pressure 149/97, and saturating 99% on room air.   GENERAL: Ill-appearing, frail elderly male who is comfortable in bed, in no apparent distress.   HEENT: Head atraumatic, normocephalic. Pupils equal and reactive to light. Pink conjunctivae. Moist oral mucosa.   NECK: Supple. No thyromegaly. No JVD.   LUNGS: Good air entry bilaterally. No wheezing, rales, or rhonchi.   HEART: S1 and S2 heard. No rubs, murmurs, or gallops.   ABDOMEN: Ascites. Bowel sounds present. No rebound. No guarding. No tenderness.   SKIN: Has lower extremity eczema with some mild ulceration and cracking of the skin with +3 edema from ankle up to the lower thigh   MUSCULOSKELETAL: No joint swelling. No clubbing.   NEUROLOGIC: Cranial nerves grossly intact. No focal motor deficits.   PSYCHIATRIC: The patient is awake, alert, and oriented x3. Appears to be in a depressed mood.  PERTINENT LABS: Glucose 92, BUN 14, creatinine 1.26, sodium 133, potassium 3.5, chloride 99, CO2 26, total protein 8.5, total bilirubin 1.1, alkaline phosphatase 211, AST 121, and ALT 24. White blood cells 11.1, hemoglobin 9.2, hematocrit 27.8, and platelets 366. INR 1.2.   Urinalysis negative.   ASSESSMENT AND PLAN: This is a 59 year old male with history of stage IV hepatocellular carcinoma on palliative care therapy who presents with worsening lower extremity edema and tenderness.  1. Lower extremity edema, tenderness, and oozing which is most likely related to his advanced liver disease and portal hypertension and portal vein thrombosis. We will continue with aggressive diuresis. We will change him to IV Lasix. We will increase his Aldactone dose. As the patient has some mild erythema, we will start him on IV Unasyn for possible cellulitis which at any point can be switched to p.o. antibiotic at discharge.  2. Stage IV liver cancer. Discussed with Dr. Lorre Nick  who is covering for Dr. Doylene Canning who will evaluate the patient tomorrow. The patient has advanced stage liver cancer. Discussed with the patient and the family at length at bedside. We will have hospice service evaluate the patient in the a.m.  3. Hypertension. We will hold oral hypoglycemic agents especially since increasing the patient's Lasix and Aldactone. If blood pressure remains uncontrolled we will resume him back on Norvasc. Meanwhile, we will continue him on his clonidine home dose.  4. Chronic pain syndrome. The patient has significant pain related to his lower extremity pain and advanced liver cancer. We will increase the patient's fentanyl patch dose to 100 mcg topical patch every 72 hours. As well we will have him on IV Dilaudid 1 mg every four hours.  5. Liver cirrhosis. This is most likely related to hepatitis C and alcohol abuse. We will continue the patient on Lasix and Aldactone. As well we will continue him on Lasix. The patient does not appear to be having any encephalopathy at this point.  6. Deep vein thrombosis prophylaxis. Subcutaneous heparin 5000 units every 12 hours.  7. Gastrointestinal prophylaxis. Protonix 40 mg oral daily.   CODE STATUS: The patient is DO NOT RESUSCITATE/DO NOT INTUBATE. This was discussed at length with the patient who is awake, alert, and oriented, as well the patient's daughter-in-law at bedside and brother at bedside.   TOTAL TIME SPENT ON PATIENT CARE: 60  minutes. ____________________________ Starleen Arms, MD dse:slb D: 01/21/2012 16:44:25 ET T: 01/21/2012 17:13:07 ET JOB#: [id number]  cc: Starleen Arms, MD, <Dictator> Gerome Sam. Doylene Canning, MD Rider Ermis Teena Irani MD ELECTRONICALLY SIGNED 01/23/2012 1:23

## 2014-09-17 NOTE — Consult Note (Signed)
PATIENT NAME:  Andre Hart, Andre Hart MR#:  [id number] DATE OF BIRTH:  24-Jun-1955  DATE OF CONSULTATION:  01/22/2012  REFERRING PHYSICIAN:   CONSULTING PHYSICIAN:  Knute Neuobert G. Lorre NickGittin, MD  HISTORY OF PRESENT ILLNESS: Andre Hart is a 59 year old patient known to me and primarily followed by Dr. Doylene [facility]ninghoksi in the [facility]. He was admitted last night and is on the hospitalist services. He is followed in the Cancer Center with advanced hepatocellular cancer. His most recent treatment has been Avastin, previously on Nexavar. He is followed by Hospice. He is potentially considered for additional Avastin, decision whether or not to discontinue all antineoplastic treatment was still pending and the patient has a follow-up appointment in September. He has a background history and additional medical problems that have been listed in the admission note and are known to include chronic Hepatitis C, chronic edema and ascites, chronic kidney disease although creatinine is good at this time, diabetes, asthma, chronic anemia, history of substance abuse, history of noncompliance, and portal vein thrombosis.   SOCIAL HISTORY: He is otherwise a nonsmoker. He lives at home, Hospice is following. Hospice is currently getting him a hospital bed and he may be looking at moving to his brother's home.   FAMILY HISTORY: Noncontributory.   ALLERGIES: None.   MEDICATIONS AT HOME:  1. Hydromorphone 2 mg every four hours p.r.n. for pain.  2. Xanax 0.25 p.o. t.i.d.  3. Lactulose 30 mL p.o. twice a day. 4. Zofran 4 to 8 mg every eight hours p.r.n.  5. Clonidine 0.2 mg twice a day. 6. Proventil inhaler. 7. Colace 100 mg twice a day.  8. Amlodipine 10 mg daily.  9. Fentanyl 50 mcg patches. He was to have changed to 75 but he never stepped up the dose so he had recently been on 50. 10. Lasix 20 mg daily, taking an additional Lasix on a p.r.n. basis. 11. Aldactone 50 mg twice a day.   SYSTEM REVIEW: The patient was very comfortable. He  stated that the legs that were more swollen and weeping at home are already better and they were slightly painful but that's already resolved. He has no headache or dizziness. He has some general weakness. Had no chills or sweats. No coughing. No wheezing. No ear or jaw pain. No hemoptysis. No palpitations or retrosternal chest pain. He has chronic abdominal swelling. He does not have any current abdominal pain. No vomiting or diarrhea. Legs with waxing and waning edema, weeping, and pain as already noted. No other bruising or bleeding. No other rash. The lower extremities have chronic skin changes and rash. Denies focal weakness, headache, dizziness. History of seizure. He has had anxiety but is currently calm.   PHYSICAL EXAMINATION:   GENERAL: He is alert and cooperative. He has abdominal distention. He has muscle wasting of the extremities.   HEENT: There was no jaundice of the sclerae. There was no thrush in the mouth.  LYMPHATIC: No lymph nodes in the neck.   HEART: Regular.   ABDOMEN: He has ascites. Liver edge is palpable. Abdomen is protuberant but not tender.   LOWER EXTREMITIES: Old stasis changes. Edema is really 1+ to 2, improved from admission. He has some areas of skin that is split. No obvious cellulitis or blanching areas. No swollen joints.   NEUROLOGIC: Grossly nonfocal. Moving all extremities. Alert, cooperative. Has some memory lapses. I did not test his gait. He is not currently depressed. He is talkative, calm, interacting normally.  LABORATORY, DIAGNOSTIC, AND RADIOLOGICAL DATA: On  admission creatinine 1.26, sodium 133, potassium 3.5, bilirubin 1.1, hemoglobin 9.2. PT/INR was 1.2.   IMPRESSION AND PLAN: The patient was admitted for worsening ascites, worsening extremity edema, some cellulitis. He is already better on antibiotics and increased diuretics. His Dilaudid has been given IV and not p.o. and is very good pain control. Additional Lasix was given IV already with  good effect. With regard to pain medicine, he has been put on 100 mcg Duragesic patch. He was previous on 50.   I suggested that we step down. I wrote the order to put him down to 75 mcg Duragesic. No other changes. Would recommend quickly transitioning from IV Lasix to p.o. or cutting him back to his regular Lasix dose as he is already improved. Also, IV Dilaudid could probably quickly be changed back to p.o. IV Unasyn could likely be changed quickly maybe to p.o. Augmentin all this so that the patient could go back home with Hospice relatively quickly. He has already been followed by Hospice so Hospice later on should be reconsulted regarding his discharge planning. He already has an Oncology appointment in four weeks. No other antineoplastic treatment and no other suggestions from Oncology.   ____________________________ Knute Neu Lorre Nick, MD rgg:drc D: 01/22/2012 12:20:52 ET T: 01/22/2012 12:34:41 ET JOB#: [id number]  cc: Knute Neu. Lorre Nick, MD, <Dictator> Marin Roberts MD ELECTRONICALLY SIGNED 02/05/2012 20:51

## 2014-09-17 NOTE — Discharge Summary (Signed)
PATIENT NAME:  Andre Hart, Andre Hart MR#:  [id number] DATE OF BIRTH:  1955-11-24  DATE OF ADMISSION:  03/05/2012 DATE OF DISCHARGE:  03/06/2012  DISPOSITION: To Hospice Home.  PRESENTING COMPLAINT: Poor p.o. intake, general decline and failure to thrive.   DISCHARGE DIAGNOSES:  1. Failure to thrive.  2. Acute renal failure.  3. Advanced hepatocellular carcinoma.  4. Chronic bilateral lower extremity edema.  5. Type 2 diabetes.  6. History of hepatitis C.   CODE STATUS: NO CODE, DO NOT RESUSCITATE. The patient is admitted to [facility] with Hospice Home admit orders.   MEDICATIONS:  1. Roxanol 20 mg/mL, 0.5 to 1 mL every 1 to 2 hours as needed.  2. OxyFAST 20 mg/mL p.r.n.  3. Lorazepam 0.5 to 1 p.o. sublingual every 2 to 4 p.r.n.  4. Ranitidine 150 p.o. b.i.d.  5. ABHR suppository, 1 per rectal every 4 to 6 hours p.r.n.   CONSULTATION: Palliative Care, Dr. Ermalinda Memos.   BRIEF SUMMARY OF HOSPITAL COURSE: [name] is a 59 year old African American gentleman with multiple medical problems including advanced hepatocellular carcinoma, hepatitis C and type 2 diabetes, came in with:   1. Acute renal failure: The patient was started on IV fluids. Creatinine was 3.6. He was also on Lasix and spironolactone, which were held. He had a lot of pain from his advanced hepatocellular carcinoma. Overall the patient was declining, per family.  2. Worsening jaundice, worsening LFTs with advanced hepatocellular carcinoma: The patient was seen by Dr. Ermalinda Memos. He has a lot of pain due to his advanced cancer. Dr. Ermalinda Memos met with family, which is daughter and sister; and given the patient's overall decline and very poor prognosis, they were in agreement for the patient to go to the Hospice Home. The patient is a NO CODE, DO NOT RESUSCITATE.  3. Failure to thrive with history of advanced liver cancer, hepatitis C, and type 2 diabetes: The patient is being transferred to the [facility].   TIME SPENT: 40 minutes.   ____________________________ [name], MD sap:cbb D: 03/07/2012 07:15:09 ET T: 03/07/2012 10:54:36 ET JOB#: [id number]  cc: Roxine Whittinghill A. Posey Pronto, MD, <Dictator> Ilda Basset MD ELECTRONICALLY SIGNED 03/08/2012 9:29

## 2014-09-22 NOTE — H&P (Signed)
PATIENT NAME:  Andre Hart, Andre Hart MR#:  [id number] DATE OF BIRTH:  02-25-1956  DATE OF ADMISSION:  07/10/2011  PRIMARY CARE PHYSICIAN: He has no primary care physician.  CHIEF COMPLAINT: Severe right-sided abdominal pain x2 weeks.  HISTORY OF PRESENT ILLNESS: Andre Hart is a 59 year old African American male with past medical history of severe systemic hypertension, history of diabetes mellitus, and history of noncompliance. The patient reported that over the last two weeks he has right-sided abdominal pain, however, over the last three days the pain got worse. He rates it 8 to 9 on a scale of 10, sharp in nature, sometimes increased when he takes a deep breath or if he exhales. He denies having any fever. No chills. The patient admits having some cough with sputum production, however, nothing has exacerbated it. This is chronic for him. He attributes that to his asthma. Evaluation in the Emergency Department revealed a significant elevation of lipase reaching 1305 and also significant elevation of AST reaching 547. The patient is now being admitted to the hospital for further evaluation and management.  REVIEW OF SYSTEMS: CONSTITUTIONAL: The patient denies having any fever. No chills. No fatigue. EYES: Denies any double vision. No blurring of vision. ENT: No hearing impairment. No sore throat. No dysphagia. CARDIOVASCULAR: No chest pain. No shortness of breath. No peripheral edema. No syncope. RESPIRATORY: Admits having cough with some sputum production but nothing acute. This is chronic for him. No chest pain. No shortness of breath. GASTROINTESTINAL: Reports the right-sided abdominal pain. Denies vomiting. No diarrhea. GENITOURINARY: No dysuria or frequency of urination. MUSCULOSKELETAL: No joint swelling or pain. No muscular pain or swelling. INTEGUMENTARY: No skin rash. No ulcers. NEUROLOGIC: No focal weakness. No seizure activity. No headache. PSYCHIATRY: No anxiety or depression. ENDOCRINE: No night  sweats, heat or cold intolerance.   PAST MEDICAL HISTORY: 1. History of severe systemic hypertension. 2. History of diabetes, type II. 3. History of chronic kidney disease. 4. History of asthma. 5. Long history of noncompliance. 6. History of alcohol and cocaine abuse.  PAST SURGICAL HISTORY: 1. Sinus surgery. 2. Knee surgery.  FAMILY HISTORY: Positive for hypertension and diabetes involving both parents who are deceased. He has a daughter who has also hypertension and diabetes.  SOCIAL HISTORY: The patient is single now. 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   SOCIAL HABITS: Nonsmoker. He said he never smoked. He drinks stating "too much" but specifies that it is about 12 to a case in a week depending on the situation. He admits abusing cocaine every now and then. The last time he used cocaine was last Thursday.   ADMISSION MEDICATIONS: He is not taking medications.   ALLERGIES: No known drug allergies.  PHYSICAL EXAMINATION:  VITAL SIGNS: Blood pressure 208/114, respiratory rate 22, pulse 85, temperature 98.1, oxygen saturation 98%.  GENERAL APPEARANCE: Middle-aged male laying in bed in no acute distress.  HEAD AND NECK: No pallor. No icterus. No cyanosis.   EARS, NOSE, AND THROAT: Hearing was normal. Nasal mucosa, lips, tongue were normal.   EYES: Normal eyes and conjunctivae. Pupils difficult to measure the size and reactivity to light.   NECK: Supple. Trachea is midline. No thyromegaly. No cervical lymphadenopathy. No masses.  HEART: Normal S1, S2. No S3 or S4. No murmur. No gallop. No carotid bruits.   RESPIRATORY: Normal breathing pattern without use of accessory muscles. No rales. No wheezing.   ABDOMEN: Soft. He is not tender at all on the left side, however, he has moderate tenderness  on the right side of the abdomen of the right upper quadrant  and also right periumbilical area. No rebound. No rigidity.   SKIN: No ulcers. No subcutaneous nodules. No  edema.  MUSCULOSKELETAL: No joint swelling. No clubbing.  NEUROLOGIC: Cranial nerves II through XII are intact. No focal motor deficit.   PSYCHIATRIC: The patient is alert and oriented x3. Mood and affect were normal.   LABORATORY, DIAGNOSTIC, AND RADIOLOGICAL DATA: Chest x-ray showed no acute cardiopulmonary abnormalities. The heart size was upper normal. No effusion. No consolidation. Ultrasound of the abdomen is pending. The patient is waiting to do the ultrasound.   Serum glucose 130, BUN 33, creatinine 1.9. His creatinine in 2010 was 1.3. Serum sodium 135, potassium 3.6, GFR estimated at 47. Lipase was high reaching 1305. Total serum protein was 8.3, albumin 3.2, bilirubin 0.9, alkaline phosphatase normal at 34, ALT 78, however, AST is elevated at 547. CBC showed white count of 9,400, hemoglobin 12, hematocrit 36, platelet count 355. Urinalysis was unremarkable.  ASSESSMENT: 1. Right-sided abdominal pain. Etiology is unclear at this time. I am awaiting the ultrasound of the abdomen. Differential diagnosis will include gallbladder disease, cholelithiasis, peptic ulcer disease, gallbladder pancreatitis. 2. Acute pancreatitis. Differential diagnosis includes either passing gallbladder stone resulting in acute pancreatitis versus alcoholic pancreatitis. 3. Severe systemic hypertension and noncompliance with medications. 4. Chronic kidney disease, stage III. 5. Chronic alcoholism and history of cocaine abuse.  6. Chronic asthma. 7. Diabetes mellitus, type II, on no treatment.  PLAN: 1. Admit the patient to the medical floor. 2. Start IV fluids. 3. Keep the patient n.p.o.  4. Obtain results of the ultrasound. If the results are unremarkable, he is going to need CAT scan.  5. Obtain GI consultation. 6. Pain control with Dilaudid. 7. Accu-Chek and sliding scale for his diabetes. 8. I will repeat his amylase and lipase in the morning and repeat liver function tests as well. 9. For blood  pressure control, I will place him on clonidine 0.2 mg twice a day along with amlodipine 10 mg a day. Will monitor his blood pressure.  TIME NEEDED TO EVALUATE THIS PATIENT: More than 55 minutes.  ____________________________ Carney CornersAmir M. Rudene Rearwish, MD amd:drc D: 07/10/2011 23:22:12 ET T: 07/11/2011 08:47:06 ET JOB#: [id number]  cc: Carney CornersAmir M. Rudene Rearwish, MD, <Dictator> Karolee OhsAMIR Dala DockM Shameka Aggarwal MD ELECTRONICALLY SIGNED 07/11/2011 22:10

## 2014-09-22 NOTE — Consult Note (Signed)
PATIENT NAME:  Andre Hart, TATA MR#:  [id number] DATE OF BIRTH:  Jul 26, 1955  DATE OF CONSULTATION:  07/11/2011  REFERRING PHYSICIAN:  Marlaine Hind, MD   CONSULTING PHYSICIAN:  Christena Deem, MD  REASON FOR CONSULTATION: Right-sided abdominal pain and acute pancreatitis.   HISTORY OF PRESENT ILLNESS: Mr. Poitra is a 59 year old African American male who was admitted to the hospital late last night with a complaint of abdominal pain. He states that he has been having pain in the abdomen, mostly in the right upper quadrant area, for a couple of weeks. This has waxed and waned. Then Friday it increased, more so in the right side. It seems to get worse when he takes a deep inspiration. He states he has had no nausea or vomiting. There is no radiation to the pain. There are no black stools, blood in the stools, or slimy stools, no diarrhea. He has never had a previous upper or lower scope. He has not had a cholecystectomy. He denies any problems with heartburn or dysphagia. He states that his pain currently is 7 out of 10. It was 9 out of 10 earlier today. He does take Alka-Seltzer. These, however, he states he was taking only for a week or so, this being a couple of weeks ago when he felt he had the flu. However, at that time he was taking Alka-Seltzer every 4 to 6 hours for about a week. He has no history of peptic ulcer disease. He came to the hospital, and evaluation there showed him to have an elevated lipase. He has continued with mostly right upper quadrant abdominal pain.   GI FAMILY HISTORY: Negative for colorectal cancer or liver disease. His father had gastric ulcers.   PAST MEDICAL/SURGICAL HISTORY:  1. History of severe hypertension.  2. Medical noncompliance. 3. Chronic kidney disease. 4. Type 2 diabetes. 5. History of asthma. 6. History of ongoing alcohol and cocaine abuse. He states he may drink a case of beer weekly and use cocaine at least once a week.  7. He has had sinus surgery.   8. He has had knee surgery.   SOCIAL HISTORY: He states he is a nonsmoker. Alcohol as noted, as well as cocaine use as noted.  OUTPATIENT MEDICATIONS: He does not take any medications as an outpatient.   ALLERGIES: There is no known drug allergy.   PHYSICAL EXAMINATION:  VITAL SIGNS: Temperature is 97.7, pulse 81, respirations 16, blood pressure 153/90, pulse oximetry 98.   GENERAL: He is a 59 year old African American male in no acute distress. He is in some discomfort with right-sided abdominal pain.   HEENT: Head is normocephalic, atraumatic. Eyes are anicteric. Nose: Septum midline. Oropharynx: Poor dentition.   NECK: Supple. No JVD.   HEART: Regular rate and rhythm.   LUNGS: Bilaterally clear.   ABDOMEN: Soft. He is markedly tender to palpation just below the costal margin on the right side. He splints a bit when he takes a deep breath. There is a positive Murphy's sign. There is some tenderness that radiates toward the epigastric region as well as toward the right side further but no radiation beyond the mid axillary line. The abdomen is otherwise nontender. Bowel sounds are positive, normoactive.   ANORECTAL: Anorectal exam is deferred.   EXTREMITIES: No clubbing, cyanosis, or edema.   NEUROLOGICAL: Cranial nerves II through XII are grossly intact. Muscle strength bilaterally equal and symmetric. Deep tendon reflexes bilaterally equal and symmetric.   LABORATORY, DIAGNOSTIC AND RADIOLOGICAL DATA:  When coming  to the hospital, his initial labs showed a glucose of 130, BUN 33, creatinine 1.90, sodium 135, potassium 3.6, chloride 97, CO2 26, calcium 8.7, lipase 1305.  He had a hepatic profile showing the total protein at 8.3, albumin 3.2, alkaline phosphatase 134, AST 547, ALT 78.  He has had three troponin Is run, the first two were 0.08 and 0.06, respectively. The last one was 0.04.  His urine drug screen was positive for cocaine.  Hemogram showed a white count of 9.4,  hemoglobin and hematocrit of 12.1/36.0, platelet count of 355. MCV was 90.  Urinalysis was 1+ blood, negative otherwise.  Repeat laboratories from earlier this morning showed his BUN at 26, creatinine 1.52 with some mild hydration. His hepatic profile, however, showed the albumin at 2.6, total bilirubin 1.1. This is elevated from admission. His AST was 393.  He had a PA and lateral chest film that did not show any evidence of pneumonia.  He had an abdominal ultrasound showing questionable flow irregularities or filling defects in the portal vein which could possibly reflect partial thrombosis. No focal masses. Echotexture of the liver was heterogenous, and CT scanning was recommended.   ASSESSMENT: Right upper quadrant pain: Abnormal abdominal ultrasound as noted. It is of note that the ultrasound showed no evidence of wall thickening, stones, or pericholecystic fluid. They did state that there was no positive Murphy's sign. He is quite tender at this point and does show a mild Murphy's sign. There is no evidence of common bile duct dilatation. A question of possible thrombosis of the portal vein was noted. I do not believe there was a flow study done on the ultrasound.   RECOMMENDATIONS:  1. I would proceed with a CT scan of the abdomen and pelvis with contrast once he is adequately hydrated.  2. In the interim, I would consider repeating the abdominal ultrasound as a limited study for Dopplers of the portal, splenic and hepatic veins.  3. Continue IV PPI as you are.  4. The patient has not been febrile and is not on antibiotics currently.   We will follow with you. Further recommendations to follow.  ____________________________ Christena DeemMartin U. Skulskie, MD mus:cbb D: 07/11/2011 13:30:22 ET T: 07/11/2011 13:57:26 ET JOB#: [id number]  cc: Christena DeemMartin U. Skulskie, MD, <Dictator> Christena DeemMARTIN U SKULSKIE MD ELECTRONICALLY SIGNED 07/26/2011 9:15

## 2014-09-22 NOTE — H&P (Signed)
PATIENT NAME:  [name], Andre Hart#:  [id number] DATE OF BIRTH:  04/18/1956  DATE OF ADMISSION:  07/10/2011  ADDENDUM:  The patient had ultrasound done and it was unremarkable but there is a question about a mass in the liver. I am avoiding doing CAT scan with contrast due to his elevated creatinine. I will order MRI without contrast in the morning. Also, his troponin came to be slightly elevated at 0.08. I think this is hypertensive in origin along with his chronic kidney disease. Nevertheless, I will place him on telemetry monitoring and I will follow-up on his troponin level. His EKG showed normal sinus rhythm at rate of 93 per minute, left axis deviation, otherwise unremarkable EKG.   ____________________________ Carney CornersAmir M. Rudene Rearwish, MD amd:drc D: 07/10/2011 23:46:08 ET T: 07/11/2011 09:05:38 ET JOB#: [id number]  cc: Carney CornersAmir M. Rudene Rearwish, MD, <Dictator>

## 2014-09-22 NOTE — Consult Note (Signed)
Chief Complaint:   Subjective/Chief Complaint unusual affect.  ate some chinese food brought to him.  continues with ruq pain, no n/v.   VITAL SIGNS/ANCILLARY NOTES: **Vital Signs.:   13-Feb-13 14:16   Vital Signs Type Routine   Temperature Temperature (F) 99.1   Celsius 37.2   Temperature Source oral   Pulse Pulse 74   Pulse source per Dinamap   Respirations Respirations 18   Systolic BP Systolic BP 248   Diastolic BP (mmHg) Diastolic BP (mmHg) 76   Mean BP 96   BP Source Dinamap   Pulse Ox % Pulse Ox % 95   Pulse Ox Activity Level  At rest   Oxygen Delivery Room Air/ 21 %   Brief Assessment:   Cardiac Regular    Respiratory clear BS    Gastrointestinal details normal Bowel sounds normal  No rebound tenderness  mild distension, tender to palpation in the ruq, mild discomfort in the lower abdomen.   Oncology:  12-Feb-13 03:05    AFP, Tumor Marker (Serial Monitoring) 485.4  Routine Hem:  13-Feb-13 03:28    WBC (CBC) 9.7   RBC (CBC) 3.75   Hemoglobin (CBC) 11.2   Hematocrit (CBC) 34.6   Platelet Count (CBC) 287   MCV 92   MCH 29.8   MCHC 32.3   RDW 13.8  Routine Chem:  13-Feb-13 03:28    Glucose, Serum 124   BUN 23   Creatinine (comp) 1.42   Sodium, Serum 136   Potassium, Serum 4.1   Chloride, Serum 101   CO2, Serum 24   Calcium (Total), Serum 8.6  Hepatic:  13-Feb-13 03:28    Bilirubin, Total 1.1   Alkaline Phosphatase 79   SGPT (ALT) 36   SGOT (AST) 111   Total Protein, Serum 7.8   Albumin, Serum 2.3  Routine Chem:  13-Feb-13 03:28    Osmolality (calc) 277   eGFR (African American) >60   eGFR (Non-African American) 55   Anion Gap 11  Routine Hem:  13-Feb-13 03:28    Neutrophil % 73.8   Lymphocyte % 11.6   Monocyte % 14.1   Eosinophil % 0.4   Basophil % 0.1   Neutrophil # 7.1   Lymphocyte # 1.1   Monocyte # 1.4   Eosinophil # 0.0   Basophil # 0.0  Hepatic:  13-Feb-13 03:28    Bilirubin, Direct 0.4  Routine Coag:  13-Feb-13 03:28     Prothrombin 15.6   INR 1.2  Blood Glucose:  13-Feb-13 07:46    POCT Blood Glucose 116    11:36    POCT Blood Glucose 131   Assessment/Plan:  Assessment/Plan:   Assessment 1) 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  elevated alpha fetoprotein noted.  continues with ruq pain though much improved per patient.  Of note labs overall continuing to improve.   2) renal insufficiency-improving with hydration    Plan 1) planning triphasic ct of the liver/abdomen once creatinine improves enough.  continue current.  further recs to follow.   Electronic Signatures: Loistine Simas (MD)  (Signed 13-Feb-13 15:28)  Authored: Chief Complaint, VITAL SIGNS/ANCILLARY NOTES, Brief Assessment, Lab Results, Assessment/Plan   Last Updated: 13-Feb-13 15:28 by Loistine Simas (MD)

## 2014-09-22 NOTE — Consult Note (Signed)
Chief Complaint:   Subjective/Chief Complaint stable, continues with ruq abd pain, tolerating po.   VITAL SIGNS/ANCILLARY NOTES: **Vital Signs.:   14-Feb-13 05:52   Vital Signs Type Routine   Temperature Temperature (F) 98.6   Celsius 37   Temperature Source oral   Pulse Pulse 64   Pulse source per Dinamap   Respirations Respirations 20   Systolic BP Systolic BP 150   Diastolic BP (mmHg) Diastolic BP (mmHg) 84   Mean BP 106   BP Source Dinamap   Pulse Ox % Pulse Ox % 95   Pulse Ox Activity Level  At rest   Oxygen Delivery Room Air/ 21 %    08:09   Vital Signs Type POCT   Nurse Fingerstick (mg/dL) FSBS (fasting range 65-99 mg/dL) 142   Comments/Interventions  Nurse Notified    10:49   Vital Signs Type POCT   Nurse Fingerstick (mg/dL) FSBS (fasting range 65-99 mg/dL) 173   Comments/Interventions  Nurse Notified   Brief Assessment:   Cardiac Regular    Respiratory clear BS    Gastrointestinal details normal Bowel sounds normal  abdomen firm, tender to palpation in the ruq, firm in the ruq.   Routine Chem:  14-Feb-13 03:03    Glucose, Serum 130   BUN 18   Creatinine (comp) 1.16   Sodium, Serum 133   Potassium, Serum 3.4   Chloride, Serum 101   CO2, Serum 22   Calcium (Total), Serum 8.1  Hepatic:  14-Feb-13 03:03    Bilirubin, Total 0.8   Alkaline Phosphatase 70   SGPT (ALT) 27   SGOT (AST) 57   Total Protein, Serum 6.6   Albumin, Serum 2.0  Routine Chem:  14-Feb-13 03:03    Osmolality (calc) 270   eGFR (African American) >60   eGFR (Non-African American) >60   Anion Gap 10  Blood Glucose:  14-Feb-13 08:09    POCT Blood Glucose 142   Radiology Results: CT:    14-Feb-13 09:50, CT Abdomen WWO Contrast   CT Abdomen WWO Contrast    REASON FOR EXAM:  Portal vein thrombosis, abnormality right  COMMENTS:    PROCEDURE:   CT [facility] 0008  CT ABDOMEN STANDARD WITH AND WITHOUT -  Jul 15 2011 10:50 AM    RESULT:      Comparison is made to prior study dated  07/12/2011.    TECHNIQUE: Helical 3 mm sections were obtained from the lung bases   through the superior iliac crest pre, immediate and delayed intravenous   administration of 100 ml of Isovue-300.    FINDINGS: Evaluation of the lung bases demonstrates a small pleural     effusion.    Atelectasis versus infiltrate is identified within the right lung base.    Evaluation of the liver once again demonstrates diffuse heterogeneous low   attenuation involving a large portion of the right lobe of the liver.   Portal venous phase imaging demonstrates no appreciable contrast within   the right portal vein. These findings again are consistent with thrombus   within the portal vein. The left portal appears patent. Contrast is   appreciated within the hepatic arteries. No further masses or regions of   abnormal enhancement are identified within the liver parenchyma. A small   amount of perihepatic and perisplenic ascites is identified.    The kidneys, adrenals, and pancreas are unremarkable. There is no CT   evidence of bowel obstruction or secondary signs reflecting enteritis,       colitis or drainable loculated fluid collections or gross evidence of   masses. There is no evidence of an abdominal aortic aneurysm.    IMPRESSION:      1.  Findings once again consistent with thrombus within the right portal   vein.  2.  Small amount of ascites.  3.  Heterogeneous enhancement and architecture within the right lobe of   liver. A component of this may be secondary to vascular compromise due to   the portal vein thrombosis. An underlying mass within the liver is also   of diagnostic concern which also may represent an etiology for the   patient's hypercoagulable state. Liver MRI may help to further   characterize the right lobe findings, if and as clinically warranted.   Alternatively, surveillance evaluation with CT recommended, if warranted.  Thank you for this opportunity to contribute to the  care of your patient.         Verified By: HECTOR W. COOPER, M.D., MD   Assessment/Plan:  Assessment/Plan:   Assessment 1) right upper quadrant pain in the setting of abnormal ct, elevated alphaprotien, hep C positive,  concerning for hepatocellular Ca    Plan 1) recommend heme-onc consult, liver biopsy.   Electronic Signatures: Skulskie, Martin (MD)  (Signed 14-Feb-13 13:59)  Authored: Chief Complaint, VITAL SIGNS/ANCILLARY NOTES, Brief Assessment, Lab Results, Radiology Results, Assessment/Plan   Last Updated: 14-Feb-13 13:59 by Skulskie, Martin (MD) 

## 2014-09-22 NOTE — Consult Note (Signed)
Chief Complaint:   Subjective/Chief Complaint stable, abd pain present but improving.no nausea, tolerating clears.   VITAL SIGNS/ANCILLARY NOTES: **Vital Signs.:   12-Feb-13 14:00   Vital Signs Type Routine   Temperature Temperature (F) 98.1   Celsius 36.7   Temperature Source oral   Pulse Pulse 78   Pulse source per Dinamap   Respirations Respirations 18   Systolic BP Systolic BP 725   Diastolic BP (mmHg) Diastolic BP (mmHg) 76   Mean BP 103   BP Source Dinamap   Pulse Ox % Pulse Ox % 98   Pulse Ox Activity Level  At rest   Oxygen Delivery Room Air/ 21 %   Brief Assessment:   Cardiac Regular    Respiratory clear BS    Gastrointestinal details normal No rebound tenderness  No gaurding  No rigidity  mild to moderate distension, bowel sounds positive, mild generalized tenderness, still marked tenderness ruq.   Routine Hem:  11-Feb-13 07:32    WBC (CBC) 10.8   RBC (CBC) 3.69   Hemoglobin (CBC) 11.2   Hematocrit (CBC) 33.5   Platelet Count (CBC) 269   MCV 91   MCH 30.3   MCHC 33.3   RDW 14.3  Routine Chem:  11-Feb-13 07:32    Glucose, Serum 107   BUN 25   Creatinine (comp) 1.85   Sodium, Serum 132   Potassium, Serum 4.0   Chloride, Serum 100   CO2, Serum 27   Calcium (Total), Serum 8.3  Hepatic:  11-Feb-13 07:32    Bilirubin, Total 1.4   Alkaline Phosphatase 99   SGPT (ALT) 49   SGOT (AST) 320   Total Protein, Serum 7.2   Albumin, Serum 2.4  Routine Chem:  11-Feb-13 07:32    Osmolality (calc) 269   eGFR (African American) 49   eGFR (Non-African American) 40   Anion Gap 5   Lipase 155  Routine Hem:  11-Feb-13 07:32    Neutrophil % 71.5   Lymphocyte % 13.0   Monocyte % 14.7   Eosinophil % 0.7   Basophil % 0.1   Neutrophil # 7.8   Lymphocyte # 1.4   Monocyte # 1.6   Eosinophil # 0.1   Basophil # 0.0  Routine Chem:  11-Feb-13 07:32    Magnesium, Serum 1.8  Blood Glucose:  11-Feb-13 08:04    POCT Blood Glucose 119    11:39    POCT Blood  Glucose 154    17:26    POCT Blood Glucose 155    20:07    POCT Blood Glucose 108  Routine Hem:  12-Feb-13 03:10    WBC (CBC) 12.2   RBC (CBC) 3.57   Hemoglobin (CBC) 10.8   Hematocrit (CBC) 32.7   Platelet Count (CBC) 270   MCV 92   MCH 30.2   MCHC 33.0   RDW 14.2  Routine Chem:  12-Feb-13 03:10    Glucose, Serum 110   BUN 26   Creatinine (comp) 1.78   Sodium, Serum 132   Potassium, Serum 4.1   Chloride, Serum 100   CO2, Serum 25   Calcium (Total), Serum 8.8  Hepatic:  12-Feb-13 03:10    Bilirubin, Total 1.2   Alkaline Phosphatase 84   SGPT (ALT) 41   SGOT (AST) 259   Total Protein, Serum 7.7   Albumin, Serum 2.3  Routine Chem:  12-Feb-13 03:10    Osmolality (calc) 270   eGFR (African American) 51   eGFR (Non-African American) 42  Anion Gap 7  Routine Hem:  12-Feb-13 03:10    Neutrophil % 77.0   Lymphocyte % 10.4   Monocyte % 12.4   Eosinophil % 0.2   Basophil % 0.0   Neutrophil # 9.4   Lymphocyte # 1.3   Monocyte # 1.5   Eosinophil # 0.0   Basophil # 0.0  Hepatic:  12-Feb-13 03:10    Bilirubin, Direct 0.7  Routine Coag:  12-Feb-13 03:10    Prothrombin 15.6   INR 1.2  Blood Glucose:  12-Feb-13 07:27    POCT Blood Glucose 113    11:37    POCT Blood Glucose 174    16:19    POCT Blood Glucose 164    20:29    POCT Blood Glucose 134   Assessment/Plan:  Assessment/Plan:   Assessment 1) ruq abdominal pain-improving.  imaging c/w portal vein thrombosis, etiology uncertain, likely related to pancreatitis, possibly segmental hepatopathy.  No evidence of bowel ischemia on ct.  alpha fetoprotein pending, increased hyrdation hopefully to improve creatinine to allow contrasted ct. labs improving.    Plan 1) triphasic ct when clinically feasible. following.   Electronic Signatures: Loistine Simas (MD)  (Signed 12-Feb-13 22:00)  Authored: Chief Complaint, VITAL SIGNS/ANCILLARY NOTES, Brief Assessment, Lab Results, Assessment/Plan   Last Updated:  12-Feb-13 22:00 by Loistine Simas (MD)

## 2014-09-22 NOTE — Discharge Summary (Signed)
PATIENT NAME:  Andre Hart, Fernandez MR#:  [id number] DATE OF BIRTH:  23-Nov-1965  DATE OF ADMISSION:  07/10/2011 DATE OF DISCHARGE:  07/16/2011  DIAGNOSES:  1. Acute pancreatitis, resolved.  2. 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 3. Hepatitis C positive.  4. Liver mass.  5. Anemia of chronic disease.  6. Diabetes.  7. Acute renal failure, resolved.  8. Elevated AST due to alcohol abuse, possible hepatocellular cancer.  9. Elevated troponin likely due demand ischemia from cocaine use.  10. Hypertension. Overall controlled.  11. History of alcohol and cocaine abuse. 12. Fevers, possibly due to portal vein thrombosis.  13. Constipation, resolved.   DISPOSITION: The patient is being discharged home.   FOLLOWUP: Follow up with the [facility] as scheduled. Outpatient liver biopsy has been arranged for Friday, 07/23/2011 at 9:30 a.m. [facility] at 2:00 p.m.   DIET: Low sodium, 1800-calorie, ADA diet.   ACTIVITY: As tolerated.   DISCHARGE MEDICATIONS:  1. Ventolin 2 puffs q. 4-6 h. p.r.n.  2. Norvasc 10 mg daily.  3. Clonidine 0.2 mg b.i.d.  4. Norco 5/325, 1 tablet every six hours p.r.n. severe pain. 5. Dulcolax 10 mg daily p.r.n. constipation.   CONSULTATIONS:  1. GI consultation with Dr. Marva PandaSkulskie.  2. Oncology consultation with Dr. Doylene Canninghoksi.    RESULTS: Abdominal ultrasound showed right hepatic vein thrombosis. No gallstones. Chest x-ray showed no acute abnormalities. Abdominal CT scan showed abnormal appearance of the hepatic parenchyma in the posterior right lobe of the liver. This was confirmed with CT scan of the abdomen with contrast. Alpha-fetoprotein elevated at 485. Normal is less than 8.3. Hepatitis C antibody positive. Hepatitis B surface antigen and antibody were negative. Microbiology:  Blood cultures no growth to date. Hemoglobin ranging from 12.1 to 10.8. Normal platelet count. Creatinine  1.9 on admission, 1.16 by the time of discharge. Hemoglobin A1c is 6.6, elevated AST. Mildly elevated troponins. Urine drug screen positive for cocaine.   HOSPITAL COURSE: The patient is a 59 year old male with past medical history of diabetes and hypertension, noncompliant with medications- he had not taken any medications for a long period of time- asthma, alcohol and cocaine abuse, who presented with abdominal pain.  1. Pancreatitis: The patient was found to have elevated lipase on admission. He was treated conservatively with IV fluids, antiemetics, and analgesics. With conservative management, the patient's pancreatitis resolved and he was tolerating a regular diet by the time of discharge.  2. 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 An abdominal ultrasound for pancreatitis showed that the patient had a filling defect in the right portal vein.  Repeat ultrasound was done, which confirmed the same. It also showed liver heterogenicity. The patient could not have an MRI of the abdomen because of a metal plate in his face. He had acute renal failure due to dehydration. Therefore, he could not have CT with contrast. A CT scan without contrast showed some abnormality in the right posterior lobe of the liver. Alpha-fetoprotein was checked and was elevated. The patient admitted to using IV drugs and he tested positive for hepatitis C antibody. The patient was aggressively hydrated with IV fluids and once his creatinine normalized a triphasic CT scan of the abdomen with contrast was obtained which showed irregularities in the right lobe of the liver with adjacent right portal vein thrombosis. There was a high likelihood of hepatocellular cancer in view of the patient being hepatitis C positive and  having portal vein thrombosis. An inpatient biopsy was arranged for the patient but the patient was uncooperative during the procedure and biopsy could not be done. Therefore an outpatient biopsy has  been arranged for the patient. The patient was also seen by Dr. Doylene Canning in the hospital and a follow-up appointment with him has been made on 07/22/2011 at 2:00 p.m. Anemia: The patient had anemia of chronic disease that was compounded by hemodilution. After  initial fall the patient's hemoglobin remained stable.  3. Diabetes: The patient had diabetes which diet controlled. His Accu-Cheks were overall controlled. He was on an insulin sliding scale in the hospital and is being discharged home on an ADA diet.  4. Acute renal failure:  This was felt to be due to dehydration with the possibility of having underlying hypertensive nephropathy. With aggressive IV fluid hydration the patient's acute renal failure resolved.  5. Elevated AST: This was felt to be due to alcohol abuse and underlying hepatic mass.   6. Elevated troponin: This was felt to be due to demand ischemia from cocaine use. The patient had no chest pain.  7. Hypertension: The patient was not taking any medications for his hypertension. He was started on amlodipine and clonidine with good control.  8. Alcohol and cocaine: The patient was extensively counseled about cessation. 9. Fever: There is a possibility that the patient is having fever because of underlying malignancy and portal vein thrombosis. His blood cultures were negative. He was empirically treated with antibiotics initially; however, once his cultures came back negative all antibiotics were discontinued. He has remained afebrile thereafter.  10. Constipation, likely due to narcotic use: The patient was started on bowel regimen with good results.   DISPOSITION: The patient is being discharged home in stable condition.   TIME SPENT: 45 minutes.   ____________________________ Darrick Meigs, MD sp:bjt D: 07/16/2011 15:51:11 ET T: 07/17/2011 14:45:25 ET JOB#: [id number]  cc: Darrick Meigs, MD, <Dictator> [facility] Darrick Meigs MD ELECTRONICALLY SIGNED 07/20/2011  11:27

## 2014-09-22 NOTE — Consult Note (Signed)
Chief Complaint:   Subjective/Chief Complaint patient states pain is improving.  states he is passing gas, seems lethargic/sedated form meds.   VITAL SIGNS/ANCILLARY NOTES: **Vital Signs.:   11-Feb-13 18:13   Vital Signs Type Routine   Temperature Temperature (F) 101.4   Celsius 38.5   Temperature Source oral   Pulse Pulse 94   Pulse source per Dinamap   Respirations Respirations 22   Systolic BP Systolic BP 505   Diastolic BP (mmHg) Diastolic BP (mmHg) 91   Mean BP 114   BP Source Dinamap   Pulse Ox % Pulse Ox % 92   Pulse Ox Activity Level  At rest   Oxygen Delivery Room Air/ 21 %    18:52   Vital Signs Type Upon Transfer   Temperature Temperature (F) 100.5   Celsius 38   Temperature Source oral   Pulse Pulse 81   Respirations Respirations 22   Systolic BP Systolic BP 697   Diastolic BP (mmHg) Diastolic BP (mmHg) 80   Mean BP 102   Pulse Ox % Pulse Ox % 93   Pulse Ox Activity Level  At rest   Oxygen Delivery Room Air/ 21 %   Brief Assessment:   Cardiac Regular    Respiratory clear BS    Gastrointestinal details normal moderate distension, mild to moderate tenderness to palpation, seems less than on PE yesterday., bowel sounds decreased, occasional high pitch   Routine Chem:  09-Feb-13 18:20    Lipase 1305  10-Feb-13 01:49    Lipase 631   Amylase, Serum 73  Routine Hem:  11-Feb-13 07:32    WBC (CBC) 10.8   RBC (CBC) 3.69   Hemoglobin (CBC) 11.2   Hematocrit (CBC) 33.5   Platelet Count (CBC) 269   MCV 91   MCH 30.3   MCHC 33.3   RDW 14.3  Routine Chem:  11-Feb-13 07:32    Glucose, Serum 107   BUN 25   Creatinine (comp) 1.85   Sodium, Serum 132   Potassium, Serum 4.0   Chloride, Serum 100   CO2, Serum 27   Calcium (Total), Serum 8.3  Hepatic:  11-Feb-13 07:32    Bilirubin, Total 1.4   Alkaline Phosphatase 99   SGPT (ALT) 49   SGOT (AST) 320   Total Protein, Serum 7.2   Albumin, Serum 2.4  Routine Chem:  11-Feb-13 07:32    Osmolality  (calc) 269   eGFR (African American) 49   eGFR (Non-African American) 40   Anion Gap 5   Lipase 155  Routine Hem:  11-Feb-13 07:32    Neutrophil % 71.5   Lymphocyte % 13.0   Monocyte % 14.7   Eosinophil % 0.7   Basophil % 0.1   Neutrophil # 7.8   Lymphocyte # 1.4   Monocyte # 1.6   Eosinophil # 0.1   Basophil # 0.0  Routine Chem:  11-Feb-13 07:32    Magnesium, Serum 1.8  Blood Glucose:  11-Feb-13 08:04    POCT Blood Glucose 119    11:39    POCT Blood Glucose 154    17:26    POCT Blood Glucose 155   Radiology Results: [place]:    09-Feb-13 23:18, US Abdomen Limited Survey   US Abdomen Limited Survey    REASON FOR EXAM:    abd pain  COMMENTS:   May transport without cardiac monitor    PROCEDURE: [place]  - US ABDOMEN LIMITED SURVEY  - Jul 10 2011 11:18PM  RESULT: The liver exhibits heterogeneous echotexture. No discrete mass is   demonstrated. Portal venous flow is difficult to demonstrate in all   branches of the portal vein. The question is raised of possible partial   thrombosis. The gallbladder is adequately distended with no evidence of   stones or wall thickening or pericholecystic fluid. There is no positive   sonographic Murphy's sign. The common bile duct measures 3.1 mm in   diameter. The observed portions of the pancreatic head and body appear   normal. The pancreatic tail was not demonstrated.  Bowel gas limits   evaluation of the abdominal aorta and inferior vena cava. The right   kidney is normal in appearance. The left kidney and spleen are not     included in the field-of-view on this limited right upper quadrant   ultrasound examination.    IMPRESSION:   1. There are questionable flow irregularities/filling defects in the   portal vein which could reflect partial thrombosis in the appropriate   clinical setting. I see no focal hepatic masses but the echotexture the   liver is heterogeneous. Further evaluation with CT scanning is   recommended.  2. The  visualized portions of the pancreatic head and body appear normal.  3. The gallbladder and common bile duct are normal in appearance.    A preliminary report was sent to the emergency department at the   conclusion ofthe study.      Verified By: DAVID A. [place]ique, M.D., MD    11-Feb-13 09:29, US Abdomen Limited Survey   US Abdomen Limited Survey    REASON FOR EXAM:    ruq pain, please also do doppler of portal, hepatic   and splenic veins  COMMENTS:   May transport without cardiac monitor    PROCEDURE: [place]  - US ABDOMEN LIMITED SURVEY  - Jul 12 2011  9:29AM     RESULT:     FINDINGS:  The liver demonstrates an echogenic architecture. The study   was correlated with a prior study dated 07/10/2011. Evaluation of the   portal vein once again demonstrates increased echogenicity within the   right portal vein with minimal appreciable color flow. These findings are   consistent with the prior study and concerning for thrombus within the   right portal vein. Doppler flow is identified within the hepatic vein,   left portal vein, splenic vein and the hepatic artery. A small, right     pleural effusion is identified. The pancreas is not visualized.   Evaluation of the gallbladder fossa demonstrates no evidence of   pericholecystic fluid, gallstones or sludging. The gallbladder wall   measures 2.8 mm in diameter. The common bile duct measures 4.46m in   diameter.    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 Further evaluation with CT again is   recommended, if clinically warranted.      Thank you for this opportunity to contribute to the care of your patient.           Verified By: HMikki Santee M.D., MD   Assessment/Plan:  Assessment/Plan:   Assessment 1) pancreatitis-biochemically improving, pain seems to be less, but abdomen more distended.  second [place] portal dopplers more c/w PVT.  multiple possible etiologies-liver  disease, neoplasia, pancreatitis (rare) and also in the setting of cocaine abuse (positive screen).   Though amylase is normal, some concern for bowel ischemia with the recent cocaine use.  Patietn could not do MRI due to metal in head.    Plan 1) imaging discussed with radiology. will order non-contrasted ct of abd and pelvis for this evening.  will increase ivf to hopefully allow a contrasted ct in a day or 2.  will check alpha fetoprotein.  continue abx.   Electronic Signatures: Loistine Simas (MD)  (Signed 11-Feb-13 20:05)  Authored: Chief Complaint, VITAL SIGNS/ANCILLARY NOTES, Brief Assessment, Lab Results, Radiology Results, Assessment/Plan   Last Updated: 11-Feb-13 20:05 by Loistine Simas (MD)
